# Patient Record
Sex: Female | Born: 1970 | Race: White | Hispanic: No | Marital: Married | Smoking: Never smoker
Health system: Southern US, Community
[De-identification: ages and names within clinical notes are randomized; demographics above are authoritative.]

## PROBLEM LIST (undated history)

## (undated) DIAGNOSIS — E079 Disorder of thyroid, unspecified: Secondary | ICD-10-CM

## (undated) HISTORY — DX: Disorder of thyroid, unspecified: E07.9

---

## 2005-11-19 HISTORY — PX: ENDOMETRIAL ABLATION: SHX621

## 2007-11-20 HISTORY — PX: BUNIONECTOMY: SHX129

## 2007-11-20 HISTORY — PX: LASIK: SHX215

## 2013-06-03 ENCOUNTER — Encounter: Payer: Self-pay | Admitting: Internal Medicine

## 2013-06-03 ENCOUNTER — Ambulatory Visit (INDEPENDENT_AMBULATORY_CARE_PROVIDER_SITE_OTHER): Admitting: Internal Medicine

## 2013-06-03 VITALS — BP 120/70 | HR 76 | Ht 65.0 in | Wt 165.0 lb

## 2013-06-03 DIAGNOSIS — F419 Anxiety disorder, unspecified: Secondary | ICD-10-CM | POA: Insufficient documentation

## 2013-06-03 DIAGNOSIS — F411 Generalized anxiety disorder: Secondary | ICD-10-CM

## 2013-06-03 DIAGNOSIS — R002 Palpitations: Secondary | ICD-10-CM

## 2013-06-03 DIAGNOSIS — E039 Hypothyroidism, unspecified: Secondary | ICD-10-CM

## 2013-06-03 NOTE — Patient Instructions (Signed)
Your physician recommends that you schedule a follow-up appointment in: As needed  

## 2013-06-03 NOTE — Progress Notes (Signed)
OFFICE NOTE  Chief Complaint:  Family history of pulmonic valve disorder, palpitations  Primary Care Physician: Warrick Parisian, MD  HPI:  Dawn Delacruz pleasant 42 year old female with relatively little past medical history. She has a history of hypothyroidism and recently some problems with anxiety/depression. She started on Effexor and he is increasing the dose. Over the past several months she's had palpitations, but she attributes this to stress. She also is a caffeine user drinking black tea least once or twice daily. She was actually referred for evaluation of her pulmonic valve. According to the patient her father had surgery of his pulmonic valve at age 81. He was told by his cardiologist the family should be screened for pulmonary bowel disorders. She did not provide any further information about what the etiology of his heart valve problem was, but possibilities include pulmonic stenosis which is either congenital or perhaps acquired pneumonic insufficiency. I understand after talking to [name], that he had a history of alcohol, tobacco and drug use in the past. Certainly this could put him at increased risk of acquired abnormalities of the pulmonic valve, including possible endocarditis. Overall she is fairly asymptomatic. She is able to exercise without worsening shortness of breath or chest pain. She recently went hiking and had some problems with tightness in her Achilles, but no claudication. She reports some of the palpitations at night, but are generally tolerable. She's never been told she has a heart murmur. Her brother does have a heart murmur, but was faint and not related to the pulmonic valve.  PMHx:  Past Medical History  Diagnosis Date  . Thyroid disorder     Thyroid levels elevated    Past Surgical History  Procedure Laterality Date  . Cesarean section  1999  . Bunionectomy  2009  . Lasik  2009  . Endometrial ablation  2007    w/ Tubal Ligation    FAMHx:   Family History  Problem Relation Age of Onset  . Cancer Mother 68    Lung and Brain cancer  . Cancer Father 98    Bladder cancer  . Stroke Maternal Grandmother 78  . Diabetes Maternal Grandmother 50  . Heart disease Maternal Grandfather 80  . Cancer Maternal Grandfather 96    Prostate cancer  . Cancer Paternal Grandmother 78    Bone cancer  . Heart disease Paternal Grandfather     SOCHx:   reports that she has never smoked. She has never used smokeless tobacco. She reports that  drinks alcohol. She reports that she does not use illicit drugs.  ALLERGIES:  No Known Allergies  ROS: A comprehensive review of systems was negative except for: Cardiovascular: positive for palpitations Behavioral/Psych: positive for anxiety and depression  HOME MEDS: Current Outpatient Prescriptions  Medication Sig Dispense Refill  . Beclomethasone Dipropionate (QNASL) 80 MCG/ACT AERS Place 2 sprays into the nose daily.      Marland Kitchen levothyroxine (SYNTHROID, LEVOTHROID) 50 MCG tablet Take 50 mcg by mouth daily before breakfast.      . venlafaxine (EFFEXOR) 37.5 MG tablet Take 75 mg by mouth daily.       No current facility-administered medications for this visit.    LABS/IMAGING: No results found for this or any previous visit (from the past 48 hour(s)). No results found.  VITALS: BP 120/70  Pulse 76  Ht 5\' 5"  (1.651 m)  Wt 165 lb (74.844 kg)  BMI 27.46 kg/m2  EXAM: General appearance: alert and no distress Neck: no adenopathy, no carotid  bruit, no JVD, supple, symmetrical, trachea midline and thyroid not enlarged, symmetric, no tenderness/mass/nodules Lungs: clear to auscultation bilaterally Heart: regular rate and rhythm, S1, S2 normal, no murmur, click, rub or gallop Abdomen: soft, non-tender; bowel sounds normal; no masses,  no organomegaly Extremities: extremities normal, atraumatic, no cyanosis or edema Pulses: 2+ and symmetric Skin: Skin color, texture, turgor normal. No rashes or  lesions Neurologic: Grossly normal Psych: Mood and affect normal  EKG: Normal sinus rhythm at 76, no signs of right heart strain or enlargement  ASSESSMENT: 1. Palpitations 2. Family history of unknown pulmonic valve disorder  PLAN: 1.   Dawn Delacruz has some palpitations which are likely benign related to stress, anxiety and caffeine use. I counseled her on how to improve some of these. With regards to her father, is difficult to say what the etiology of his pulmonic disorder is without more information. I've asked her to try to gather that for me. I do not appreciate any heart murmurs on exam, and there are no EKG changes suggestive of right heart enlargement. This would commonly be seen with pulmonic stenosis. Oftentimes pulmonic stenosis is seen along with other congenital abnormalities or genetic disorders such as Noonan syndrome. She does not have any of the typical phenotypic characteristics of this disorder. At this time I don't feel that echocardiography is necessary to evaluate her valve unless there was more compelling information of a condition that is necessary to screen for. We can see her back as needed.  Chrystie Nose, MD, [facility] Attending Cardiologist [facility]  Alianny Toelle C 06/03/2013, 10:46 AM

## 2013-06-05 ENCOUNTER — Encounter: Payer: Self-pay | Admitting: Internal Medicine

## 2015-03-18 ENCOUNTER — Other Ambulatory Visit: Payer: Self-pay | Admitting: Physician Assistant

## 2015-09-06 ENCOUNTER — Other Ambulatory Visit: Payer: Self-pay | Admitting: Physician Assistant

## 2017-03-29 ENCOUNTER — Emergency Department (HOSPITAL_COMMUNITY)

## 2017-03-29 ENCOUNTER — Encounter (HOSPITAL_COMMUNITY): Payer: Self-pay | Admitting: *Deleted

## 2017-03-29 ENCOUNTER — Emergency Department (HOSPITAL_COMMUNITY)
Admission: EM | Admit: 2017-03-29 | Discharge: 2017-03-29 | Disposition: A | Discharge status: Home / Self Care | Attending: Emergency Medicine | Admitting: Emergency Medicine

## 2017-03-29 DIAGNOSIS — E039 Hypothyroidism, unspecified: Secondary | ICD-10-CM | POA: Diagnosis not present

## 2017-03-29 DIAGNOSIS — S5002XA Contusion of left elbow, initial encounter: Secondary | ICD-10-CM | POA: Diagnosis not present

## 2017-03-29 DIAGNOSIS — Y999 Unspecified external cause status: Secondary | ICD-10-CM | POA: Diagnosis not present

## 2017-03-29 DIAGNOSIS — S5012XA Contusion of left forearm, initial encounter: Secondary | ICD-10-CM

## 2017-03-29 DIAGNOSIS — Y929 Unspecified place or not applicable: Secondary | ICD-10-CM | POA: Diagnosis not present

## 2017-03-29 DIAGNOSIS — Y939 Activity, unspecified: Secondary | ICD-10-CM | POA: Insufficient documentation

## 2017-03-29 DIAGNOSIS — W2203XA Walked into furniture, initial encounter: Secondary | ICD-10-CM | POA: Insufficient documentation

## 2017-03-29 DIAGNOSIS — S59902A Unspecified injury of left elbow, initial encounter: Secondary | ICD-10-CM | POA: Diagnosis present

## 2017-03-29 NOTE — ED Triage Notes (Signed)
Pt c/o lt forearm pain she struck it on the foot board of her bed  lmp none

## 2017-03-29 NOTE — ED Provider Notes (Signed)
[facility]-EMERGENCY DEPT Provider Note   CSN: [id number] Arrival date & time: 03/29/17  2031  By signing my name below, I, Dawn Delacruz, attest that this documentation has been prepared under the direction and in the presence of Kerrie Buffalo, NP. Electronically Signed: Cynda Delacruz, Scribe. 03/29/17. 9:51 PM.  History   Chief Complaint Chief Complaint  Patient presents with  . Arm Injury   HPI Comments: Dawn Delacruz is a 46 y.o. female with no pertinent past medical history, who presents to the Emergency Department complaining of sudden-onset, constant left arm pain that began earlier today. Patient states she was playing with her son, when she fell off of the bed and hit the head board, injuring her left forearm. Patient reports associated right knee pain. No modifying factors indicated. Patient denies any fever, chills, nausea, vomiting, numbness, or weakness.  She also hit her right knee but not bad.   The history is provided by the patient. No language interpreter was used.    Past Medical History:  Diagnosis Date  . Thyroid disorder    Thyroid levels elevated    Patient Active Problem List   Diagnosis Date Noted  . Hypothyroid 06/03/2013  . Anxiety 06/03/2013  . Palpitations 06/03/2013    Past Surgical History:  Procedure Laterality Date  . BUNIONECTOMY  2009  . CESAREAN SECTION  1999  . ENDOMETRIAL ABLATION  2007   w/ Tubal Ligation  . LASIK  2009    OB History    No data available       Home Medications    Prior to Admission medications   Medication Sig Start Date End Date Taking? Authorizing Provider  Beclomethasone Dipropionate (QNASL) 80 MCG/ACT AERS Place 2 sprays into the nose daily.    [provider]  levothyroxine (SYNTHROID, LEVOTHROID) 50 MCG tablet Take 50 mcg by mouth daily before breakfast.    [provider]  venlafaxine (EFFEXOR) 37.5 MG tablet Take 75 mg by mouth daily.    [provider]    Family  History Family History  Problem Relation Age of Onset  . Cancer Mother 33       Lung and Brain cancer  . Cancer Father 42       Bladder cancer  . Stroke Maternal Grandmother 60  . Diabetes Maternal Grandmother 50  . Heart disease Maternal Grandfather 80  . Cancer Maternal Grandfather 18       Prostate cancer  . Cancer Paternal Grandmother 56       Bone cancer  . Heart disease Paternal Grandfather     Social History Social History  Substance Use Topics  . Smoking status: Never Smoker  . Smokeless tobacco: Never Used  . Alcohol use Yes     Comment: Socailly, 1 drink every 2-3 months     Allergies   Patient has no known allergies.   Review of Systems Review of Systems  Constitutional: Negative for chills and fever.  Gastrointestinal: Negative for nausea and vomiting.  Musculoskeletal: Positive for arthralgias (right knee, elft forearm).  Skin: Negative for wound.  Neurological: Negative for syncope, weakness, numbness and headaches.  Psychiatric/Behavioral: Negative for confusion.     Physical Exam Updated Vital Signs BP 132/69 (BP Location: Right Arm)   Pulse 84   Temp 97.3 F (36.3 C) (Oral)   Resp 18   SpO2 99%   Physical Exam  Constitutional: She is oriented to person, place, and time. She appears well-developed and well-nourished. No distress.  HENT:  Head: Normocephalic.  Eyes: EOM are normal.  Neck: Neck supple.  Cardiovascular: Normal rate.   Pulmonary/Chest: Effort normal.  Abdominal: Soft. There is no tenderness.  Musculoskeletal: Normal range of motion.  2+ radial pulses, adequate circulation. Swelling and pain to the left forearm. Full range of motion of the wrist, elbow, and shoulder.  Right knee with minimal swelling and tenderness, patient is ambulating without difficulty.  Neurological: She is alert and oriented to person, place, and time. No cranial nerve deficit.  Skin: Skin is warm and dry.  Psychiatric: She has a normal mood and  affect.  Nursing note and vitals reviewed.   ED Treatments / Results  DIAGNOSTIC STUDIES: Oxygen Saturation is 99% on RA, normal by my interpretation.    COORDINATION OF CARE: 9:50 PM Discussed treatment plan with pt at bedside and pt agreed to plan.   Labs (all labs ordered are listed, but only abnormal results are displayed) Labs Reviewed - No data to display  Radiology Dg Forearm Left  Result Date: 03/29/2017 CLINICAL DATA:  Fall with forearm pain EXAM: LEFT FOREARM - 2 VIEW COMPARISON:  None. FINDINGS: There is no evidence of fracture or other focal bone lesions. Soft tissues are unremarkable. IMPRESSION: Negative. Electronically Signed   By: Deatra RobinsonKevin  Herman M.D.   On: 03/29/2017 21:07    Procedures Procedures (including critical care time)  Medications Ordered in ED Medications - No data to display   Initial Impression / Assessment and Plan / ED Course  I have reviewed the triage vital signs and the nursing notes.  Pertinent imaging results that were available during my care of the patient were reviewed by me and considered in my medical decision making (see chart for details).    Final Clinical Impressions(s) / ED Diagnoses  46 y.o. female with contusion to the left forearm and right knee stable for d/c without focal neuro deficits and no fracture/dislocation noted on x-ray. Will treat with NSAIDS and she will ice and elevate the area. She will f/u with her PCP or return here as needed. Final diagnoses:  Contusion of elbow and forearm, left, initial encounter    New Prescriptions Discharge Medication List as of 03/29/2017  9:57 PM     I personally performed the services described in this documentation, which was scribed in my presence. The recorded information has been reviewed and is accurate.     Kerrie Buffaloeese, Naelani Lafrance AdairM, [place]NP 03/31/17 1652    Clarene DukeLittle, Ambrose Finlandachel Morgan, MD 04/04/17 [phone]

## 2017-03-29 NOTE — ED Notes (Signed)
Pt states she was playing with her son and fell off the bed. Negative deformity or swelling.

## 2017-10-09 ENCOUNTER — Encounter: Payer: Self-pay | Admitting: Pulmonary Disease

## 2017-10-09 ENCOUNTER — Ambulatory Visit (INDEPENDENT_AMBULATORY_CARE_PROVIDER_SITE_OTHER): Admitting: Pulmonary Disease

## 2017-10-09 DIAGNOSIS — G4710 Hypersomnia, unspecified: Secondary | ICD-10-CM

## 2017-10-09 NOTE — Addendum Note (Signed)
Addended by: Cornell BarmanWHITTAKER, Daquon Greenleaf M on: 10/09/2017 10:40 AM   Modules accepted: Orders

## 2017-10-09 NOTE — Progress Notes (Signed)
Subjective:    Patient ID: Dawn Delacruz, female    DOB: 10/04/1971, 46 y.o.   MRN: [id number]  HPI  Chief Complaint  Patient presents with  . pulm consult    Pt referred by Dr. Charlies SilversJennifer Couillard MD. Pt sleeps about 6-7 hours each night, up at 5-6 times due to husband snoring and not using his cpap machine. Pt has daytime sleepiness really bad, where she arrives at a location early to take a nap prior.    46 year old woman presents for evaluation of excessive daytime somnolence. She reports feeling exhausted all the time and never feels refreshed after sleeping.  She has unexplained weight gain of 20 pounds.  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  She has to take frequent naps and sometimes make excuses such as going to the toilet to take a 10-minute nap she tries to get to replace.  A few minutes before and takes a nap in the car. Her husband has obstructive sleep apnea sleepiness score is 17 and she reports sleepiness in various situations such as sitting inactive, as a passenger in a car, lying down rest in the afternoons, or sitting quietly after lunch.  Bedtime is between 1030 and 11 PM, sleep latency is about an hour, she sleeps on her side with 1-2 pillows, reports 5-6 nocturnal awakenings including one bathroom visit and wonders if this is due to her husband snoring, and is out of bed at 7 AM feeling tired with occasional dryness of mouth and headaches.  On the weekends she will stay in bed about an hour later and takes a 30-minute nap on the weekends.  As a  young adult she would sleep around 2 AM and wake up around noon.  She has used a mouthguard for bruxism about about 12 years.  She drinks hot tea about 3 times daily and has decaffeinated tea with dinner. She reports occasional palpitations once or twice a year. She has been diagnosed as hypothyroid and takes thyroid supplements  There is no history suggestive of cataplexy, sleep paralysis or  parasomnias      Past Medical History:  Diagnosis Date  . Thyroid disorder    Thyroid levels elevated   Past Surgical History:  Procedure Laterality Date  . BUNIONECTOMY  2009  . CESAREAN SECTION  1999  . ENDOMETRIAL ABLATION  2007   w/ Tubal Ligation  . LASIK  2009    No Known Allergies   Social History   Socioeconomic History  . Marital status: Married    Spouse name: Not on file  . Number of children: Not on file  . Years of education: Not on file  . Highest education level: Not on file  Social Needs  . Financial resource strain: Not on file  . Food insecurity - worry: Not on file  . Food insecurity - inability: Not on file  . Transportation needs - medical: Not on file  . Transportation needs - non-medical: Not on file  Occupational History  . Not on file  Tobacco Use  . Smoking status: Never Smoker  . Smokeless tobacco: Never Used  Substance and Sexual Activity  . Alcohol use: Yes    Comment: Socailly, 1 drink every 2-3 months  . Drug use: No  . Sexual activity: Not on file  Other Topics Concern  . Not on file  Social History Narrative  . Not on file     Family History  Problem Relation Age of  Onset  . Cancer Mother [id number]       Lung and Brain cancer  . Cancer Father 2150       Bladder cancer  . Stroke Maternal Grandmother 8659  . Diabetes Maternal Grandmother 50  . Heart disease Maternal Grandfather 80  . Cancer Maternal Grandfather 10370       Prostate cancer  . Cancer Paternal Grandmother 880       Bone cancer  . Heart disease Paternal Grandfather      Review of Systems  Constitutional: Positive for unexpected weight change. Negative for fever.  HENT: Positive for congestion, sinus pressure and sneezing. Negative for dental problem, ear pain, nosebleeds, postnasal drip, rhinorrhea, sore throat and trouble swallowing.   Eyes: Negative for redness and itching.  Respiratory: Positive for chest tightness. Negative for cough, shortness of breath and  wheezing.   Cardiovascular: Positive for palpitations. Negative for leg swelling.  Gastrointestinal: Negative for nausea and vomiting.  Genitourinary: Negative for dysuria.  Musculoskeletal: Negative for joint swelling.  Skin: Negative for rash.  Allergic/Immunologic: Negative.  Negative for environmental allergies, food allergies and immunocompromised state.  Neurological: Positive for headaches.  Hematological: Does not bruise/bleed easily.  Psychiatric/Behavioral: Negative for dysphoric mood. The patient is nervous/anxious.        Objective:   Physical Exam  Gen. Pleasant, well-nourished, in no distress, normal affect ENT - no lesions, no post nasal drip Neck: No JVD, no thyromegaly, no carotid bruits Lungs: no use of accessory muscles, no dullness to percussion, clear without rales or rhonchi  Cardiovascular: Rhythm regular, heart sounds  normal, no murmurs or gallops, no peripheral edema Abdomen: soft and non-tender, no hepatosplenomegaly, BS normal. Musculoskeletal: No deformities, no cyanosis or clubbing Neuro:  alert, non focal       Assessment & Plan:

## 2017-10-09 NOTE — Patient Instructions (Signed)
Home sleep study will be scheduled.  We discussed possibilities of delayed sleep phase syndrome and narcolepsy.   Advised light exposure for 30 minutes in the morning around 9 AM  Keep sleep diary prior to next visit

## 2017-10-09 NOTE — Assessment & Plan Note (Signed)
Given excessive daytime somnolence, narrow pharyngeal exam, witnessed apneas & loud snoring, obstructive sleep apnea is very likely & an overnight polysomnogram will be scheduled as a home study. The pathophysiology of obstructive sleep apnea , it's cardiovascular consequences & modes of treatment including CPAP were discused with the patient in detail & they evidenced understanding.  Home sleep study will be scheduled.  We discussed possibilities of delayed sleep phase syndrome and narcolepsy.   Advised light exposure for 30 minutes in the morning around 9 AM  Keep sleep diary prior to next visit

## 2017-10-31 DIAGNOSIS — G4733 Obstructive sleep apnea (adult) (pediatric): Secondary | ICD-10-CM | POA: Diagnosis not present

## 2017-11-01 DIAGNOSIS — G4733 Obstructive sleep apnea (adult) (pediatric): Secondary | ICD-10-CM | POA: Diagnosis not present

## 2017-11-04 ENCOUNTER — Telehealth: Payer: Self-pay | Admitting: Pulmonary Disease

## 2017-11-04 NOTE — Telephone Encounter (Signed)
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 Does not explain sleepiness. Suggests another SS such as a NPSG and MSLT to rule out narcolepsy.

## 2017-11-06 ENCOUNTER — Other Ambulatory Visit: Payer: Self-pay | Admitting: *Deleted

## 2017-11-06 DIAGNOSIS — G4710 Hypersomnia, unspecified: Secondary | ICD-10-CM

## 2017-11-06 NOTE — Telephone Encounter (Signed)
Spoke with patient. She is aware of results and options. She wants to think about the next series of SS first. She will call the office back once she has made up her mind. Will keep this encounter open until she calls back.

## 2020-01-05 ENCOUNTER — Other Ambulatory Visit: Payer: Self-pay

## 2020-01-05 ENCOUNTER — Emergency Department (HOSPITAL_COMMUNITY)
Admission: EM | Admit: 2020-01-05 | Discharge: 2020-01-05 | Disposition: A | Discharge status: Home / Self Care | Attending: Emergency Medicine | Admitting: Emergency Medicine

## 2020-01-05 ENCOUNTER — Encounter (HOSPITAL_COMMUNITY): Payer: Self-pay | Admitting: Emergency Medicine

## 2020-01-05 ENCOUNTER — Emergency Department (HOSPITAL_COMMUNITY)

## 2020-01-05 DIAGNOSIS — E039 Hypothyroidism, unspecified: Secondary | ICD-10-CM | POA: Diagnosis not present

## 2020-01-05 DIAGNOSIS — Z79899 Other long term (current) drug therapy: Secondary | ICD-10-CM | POA: Diagnosis not present

## 2020-01-05 DIAGNOSIS — Z881 Allergy status to other antibiotic agents status: Secondary | ICD-10-CM | POA: Diagnosis not present

## 2020-01-05 DIAGNOSIS — Z888 Allergy status to other drugs, medicaments and biological substances status: Secondary | ICD-10-CM | POA: Insufficient documentation

## 2020-01-05 DIAGNOSIS — R05 Cough: Secondary | ICD-10-CM | POA: Diagnosis present

## 2020-01-05 DIAGNOSIS — U071 COVID-19: Secondary | ICD-10-CM | POA: Diagnosis not present

## 2020-01-05 LAB — COMPREHENSIVE METABOLIC PANEL
ALT: 31 U/L (ref 0–44)
AST: 20 U/L (ref 15–41)
Albumin: 4.3 g/dL (ref 3.5–5.0)
Alkaline Phosphatase: 88 U/L (ref 38–126)
Anion gap: 9 (ref 5–15)
BUN: 14 mg/dL (ref 6–20)
CO2: 26 mmol/L (ref 22–32)
Calcium: 9.5 mg/dL (ref 8.9–10.3)
Chloride: 104 mmol/L (ref 98–111)
Creatinine, Ser: 0.93 mg/dL (ref 0.44–1.00)
GFR calc Af Amer: >60 mL/min (ref >60)
GFR calc non Af Amer: >60 mL/min (ref >60)
Glucose, Bld: 89 mg/dL (ref 70–99)
Potassium: 3.9 mmol/L (ref 3.5–5.1)
Sodium: 139 mmol/L (ref 135–145)
Total Bilirubin: 0.2 mg/dL — ABNORMAL LOW (ref 0.3–1.2)
Total Protein: 7.4 g/dL (ref 6.5–8.1)

## 2020-01-05 LAB — CBC WITH DIFFERENTIAL/PLATELET
Abs Immature Granulocytes: 0.02 10*3/uL (ref 0.00–0.07)
Basophils Absolute: 0 10*3/uL (ref 0.0–0.1)
Basophils Relative: 0 %
Eosinophils Absolute: 0.1 10*3/uL (ref 0.0–0.5)
Eosinophils Relative: 2 %
HCT: 42.5 % (ref 36.0–46.0)
Hemoglobin: 14.5 g/dL (ref 12.0–15.0)
Immature Granulocytes: 0 %
Lymphocytes Relative: 31 %
Lymphs Abs: 2.5 10*3/uL (ref 0.7–4.0)
MCH: 27.6 pg (ref 26.0–34.0)
MCHC: 34.1 g/dL (ref 30.0–36.0)
MCV: 80.8 fL (ref 80.0–100.0)
Monocytes Absolute: 0.8 10*3/uL (ref 0.1–1.0)
Monocytes Relative: 10 %
Neutro Abs: 4.6 10*3/uL (ref 1.7–7.7)
Neutrophils Relative %: 57 %
Platelets: 261 10*3/uL (ref 150–400)
RBC: 5.26 MIL/uL — ABNORMAL HIGH (ref 3.87–5.11)
RDW: 12.2 % (ref 11.5–15.5)
WBC: 8 10*3/uL (ref 4.0–10.5)
nRBC: 0 % (ref 0.0–0.2)

## 2020-01-05 LAB — TROPONIN I (HIGH SENSITIVITY): Troponin I (High Sensitivity): <2 ng/L (ref <18)

## 2020-01-05 MED ORDER — KETOROLAC TROMETHAMINE 15 MG/ML IJ SOLN
15.0000 mg | Freq: Once | INTRAMUSCULAR | Status: AC
  Administered 2020-01-05: 15 mg via INTRAVENOUS
  Filled 2020-01-05: qty 1

## 2020-01-05 MED ORDER — ALBUTEROL SULFATE HFA 108 (90 BASE) MCG/ACT IN AERS
6.0000 | INHALATION_SPRAY | Freq: Once | RESPIRATORY_TRACT | Status: AC
  Administered 2020-01-05: 6 via RESPIRATORY_TRACT
  Filled 2020-01-05: qty 6.7

## 2020-01-05 MED ORDER — ACETAMINOPHEN 325 MG PO TABS
650.0000 mg | ORAL_TABLET | Freq: Once | ORAL | Status: AC
Start: 2020-01-05 — End: 2020-01-05
  Administered 2020-01-05: 650 mg via ORAL
  Filled 2020-01-05: qty 2

## 2020-01-05 MED ORDER — BENZONATATE 100 MG PO CAPS: 100.0000 mg | ORAL_CAPSULE | Freq: Three times a day (TID) | ORAL | 0 refills | Status: DC

## 2020-01-05 MED ORDER — SODIUM CHLORIDE 0.9 % IV BOLUS
500.0000 mL | Freq: Once | INTRAVENOUS | Status: AC
Start: 2020-01-05 — End: 2020-01-05
  Administered 2020-01-05: 500 mL via INTRAVENOUS

## 2020-01-05 MED ORDER — ONDANSETRON HCL 4 MG/2ML IJ SOLN
4.0000 mg | Freq: Once | INTRAMUSCULAR | Status: AC
  Administered 2020-01-05: 4 mg via INTRAVENOUS
  Filled 2020-01-05: qty 2

## 2020-01-05 NOTE — ED Provider Notes (Signed)
[facility]-EMERGENCY DEPT Provider Note   CSN: [id number] Arrival date & time: 01/05/20  1658     History Chief Complaint  Patient presents with  . Shortness of Breath    Covid on 2/2  . Cough    Dawn Delacruz is a 49 y.o. female presenting for evaluation of shortness of breath and chest pressure.   Patient states she was diagnosed with Covid 2 weeks ago on February 2.  Patient states her symptoms were improving until 5 days ago.  She then developed worsening shortness of breath, chills, and headache.  She saw her PCP yesterday, had an x-ray which showed pneumonia and she was started on antibiotics.  She states today she is having worsening shortness of breath and chest pressure.  She has pain anytime she breathes in.  She reports chills and headache.  She has been taking the Z-Pak as prescribed.  She used the inhaler this morning with mild improvement which was short-lived.  She was prescribed prednisone when she initially tested positive for Covid, but did not take it due to history of adverse side effects.  She has not taken anything for pain/chills including tylenol or ibuprofen.  HPI     Past Medical History:  Diagnosis Date  . Thyroid disorder    Thyroid levels elevated    Patient Active Problem List   Diagnosis Date Noted  . Hypersomnolence 10/09/2017  . Hypothyroid 06/03/2013  . Anxiety 06/03/2013  . Palpitations 06/03/2013    Past Surgical History:  Procedure Laterality Date  . BUNIONECTOMY  2009  . CESAREAN SECTION  1999  . ENDOMETRIAL ABLATION  2007   w/ Tubal Ligation  . LASIK  2009     OB History   No obstetric history on file.     Family History  Problem Relation Age of Onset  . Cancer Mother 59       Lung and Brain cancer  . Cancer Father 26       Bladder cancer  . Stroke Maternal Grandmother 61  . Diabetes Maternal Grandmother 50  . Heart disease Maternal Grandfather 80  . Cancer Maternal Grandfather 23       Prostate  cancer  . Cancer Paternal Grandmother 92       Bone cancer  . Heart disease Paternal Grandfather     Social History   Tobacco Use  . Smoking status: Never Smoker  . Smokeless tobacco: Never Used  Substance Use Topics  . Alcohol use: Yes    Comment: Socailly, 1 drink every 2-3 months  . Drug use: No    Home Medications Prior to Admission medications   Medication Sig Start Date End Date Taking? Authorizing Provider  albuterol (VENTOLIN HFA) 108 (90 Base) MCG/ACT inhaler Inhale 2 puffs into the lungs every 6 (six) hours as needed for wheezing or shortness of breath. 01/04/20  Yes [provider]  aspirin 81 MG chewable tablet Chew 81 mg by mouth daily.   Yes [provider]  azithromycin (ZITHROMAX) 250 MG tablet Take 250-500 mg by mouth as directed. 01/04/20  Yes [provider]  escitalopram (LEXAPRO) 10 MG tablet Take 10 mg by mouth daily. 08/19/19  Yes [provider]  levothyroxine (SYNTHROID) 100 MCG tablet Take 100 mcg by mouth daily. 11/12/19  Yes [provider]  predniSONE (DELTASONE) 10 MG tablet Take 10 mg by mouth 2 (two) times daily. 12/29/19  Yes [provider]  trimethoprim-polymyxin b (POLYTRIM) ophthalmic solution Place 1 drop  into both eyes 6 (six) times daily. 12/29/19  Yes [provider]  vitamin B-12 (CYANOCOBALAMIN) 500 MCG tablet Take 500 mcg by mouth daily.   Yes [provider]  zinc gluconate 50 MG tablet Take 50 mg by mouth daily.   Yes [provider]  benzonatate (TESSALON) 100 MG capsule Take 1 capsule (100 mg total) by mouth every 8 (eight) hours. 01/05/20   Ranika Mcniel, PA-C    Allergies    Doxycycline and Progesterone  Review of Systems   Review of Systems  Constitutional: Positive for chills.  Respiratory: Positive for cough, chest tightness and shortness of breath.   Cardiovascular: Positive for chest pain.  Gastrointestinal: Positive for nausea.  All other  systems reviewed and are negative.   Physical Exam Updated Vital Signs BP 100/64   Pulse 70   Temp 98.4 F (36.9 C)   Resp 19   SpO2 100%   Physical Exam Vitals and nursing note reviewed.  Constitutional:      General: She is not in acute distress.    Appearance: She is well-developed.     Comments: Appears nontoxic  HENT:     Head: Normocephalic and atraumatic.  Eyes:     Extraocular Movements: Extraocular movements intact.     Conjunctiva/sclera: Conjunctivae normal.     Pupils: Pupils are equal, round, and reactive to light.  Cardiovascular:     Rate and Rhythm: Normal rate and regular rhythm.     Pulses: Normal pulses.  Pulmonary:     Effort: Pulmonary effort is normal. No respiratory distress.     Breath sounds: Normal breath sounds. No wheezing.     Comments: Intermittent cough. Speaking in full sentences. Clear lung sounds Abdominal:     General: There is no distension.     Palpations: Abdomen is soft. There is no mass.     Tenderness: There is no abdominal tenderness. There is no guarding or rebound.  Musculoskeletal:        General: Normal range of motion.     Cervical back: Normal range of motion and neck supple.     Right lower leg: No edema.     Left lower leg: No edema.     Comments: No leg pain or swelling. Pedal pulses 2+  Skin:    General: Skin is warm and dry.     Capillary Refill: Capillary refill takes less than 2 seconds.  Neurological:     Mental Status: She is alert and oriented to person, place, and time.     ED Results / Procedures / Treatments   Labs (all labs ordered are listed, but only abnormal results are displayed) Labs Reviewed  CBC WITH DIFFERENTIAL/PLATELET - Abnormal; Notable for the following components:      Result Value   RBC 5.26 (*)    All other components within normal limits  COMPREHENSIVE METABOLIC PANEL - Abnormal; Notable for the following components:   Total Bilirubin 0.2 (*)    All other components within normal  limits  TROPONIN I (HIGH SENSITIVITY)    EKG EKG Interpretation  Date/Time:  Tuesday January 05 2020 17:57:35 EST Ventricular Rate:  66 PR Interval:    QRS Duration: 79 QT Interval:  407 QTC Calculation: 427 R Axis:   90 Text Interpretation: Sinus rhythm Borderline right axis deviation no prior available for comparison Confirmed by Tilden Fossa [phone]) on 01/05/2020 6:03:27 PM   Radiology DG Chest 2 View  Result Date: 01/05/2020 CLINICAL DATA:  Shortness  of breath. COVID positive. EXAM: CHEST - 2 VIEW COMPARISON:  None. FINDINGS: The heart size is normal. There is no pneumothorax or large pleural effusion. No focal infiltrate. There is a rounded density projecting over the left costophrenic angle, only visualized on the frontal view. There is no acute osseous abnormality. IMPRESSION: No acute cardiopulmonary disease. Rounded density projecting over the left costophrenic angle. This is favored to represent a nipple shadow. A follow-up two-view chest x-ray with nipple markers in 4-6 weeks is recommended for further evaluation of this finding. Electronically Signed   By: Constance Holster M.D.   On: 01/05/2020 18:56    Procedures Procedures (including critical care time)  Medications Ordered in ED Medications  ondansetron (ZOFRAN) injection 4 mg (4 mg Intravenous Given 01/05/20 1801)  acetaminophen (TYLENOL) tablet 650 mg (650 mg Oral Given 01/05/20 1801)  sodium chloride 0.9 % bolus 500 mL (0 mLs Intravenous Stopped 01/05/20 1911)  albuterol (VENTOLIN HFA) 108 (90 Base) MCG/ACT inhaler 6 puff (6 puffs Inhalation Given 01/05/20 1802)  ketorolac (TORADOL) 15 MG/ML injection 15 mg (15 mg Intravenous Given 01/05/20 2050)    ED Course  I have reviewed the triage vital signs and the nursing notes.  Pertinent labs & imaging results that were available during my care of the patient were reviewed by me and considered in my medical decision making (see chart for details).    MDM  Rules/Calculators/A&P                      Patient presenting for evaluation of chest heaviness and shortness of breath in the setting of recent Covid infection.  She was evaluated by her PCP yesterday, reviewed visit notes and results.  I cannot see the x-ray myself, report states there is possible some pneumonia.  D-dimer was negative.  As such, doubt PE.  Consider myocarditis in the setting of Covid.  Consider dehydration.  Consider electrolyte abnormality.  Consider worsening of pneumonia.  Will obtain labs, chest x-ray, troponin, EKG.  Labs interpreted by me, overall reassuring.  Troponin negative.  No leukocytosis.  Electrolytes stable.  Creatinine normal.  Chest x-ray viewed interpreted by me, no pneumonia, proximal effusion, cardiomegaly.  EKG without STEMI.  On reassessment, patient reports nausea is improved.  Headache improved.  Cough and shortness of breath improved with albuterol.  Discussed continued symptomatic treatment at home as this is likely prolonged Covid course.  Discussed close monitoring of symptoms, prompt return with worsening.  Discussed follow-up with primary care symptoms not improving.  At this time, patient appears safe for discharge.  Return precautions given.  Patient states she understands and agrees to plan.  Final Clinical Impression(s) / ED Diagnoses Final diagnoses:  [id number]    Rx / DC Orders ED Discharge Orders         Ordered    benzonatate (TESSALON) 100 MG capsule  Every 8 hours     01/05/20 2018           Franchot Heidelberg, PA-C 01/05/20 2242    Varney Biles, MD 01/06/20 1806

## 2020-01-05 NOTE — ED Triage Notes (Signed)
Pt reports was Covid + on 2/2. Reports still having SOB with exertion. Was seen at PCP yesterday and dx with PNA. Reports not feeling any better.

## 2020-01-05 NOTE — Discharge Instructions (Addendum)
Use Tylenol or ibuprofen as needed for fevers, headaches, or body aches. Use tessalon perles for cough. Use the albuterol inhaler every 4 hours for the next 2 days. After this, use as needed for shortness of breath, chest tightness, or coughing fits.  Make sure you stay well-hydrated with water. Wash your hands frequently to prevent spread of infection. Follow-up with your primary care doctor in 1 week if your symptoms are not improving. Return to the emergency room if you develop chest pain, difficulty breathing, or any new or worsening symptoms.

## 2021-09-12 IMAGING — CR DG CHEST 2V
2 series · 2 of 2 positions shown · non-contrast
Comparison: None.

CLINICAL DATA: Shortness of breath. COVID positive.

EXAM:
CHEST - 2 VIEW

[w chest pa]
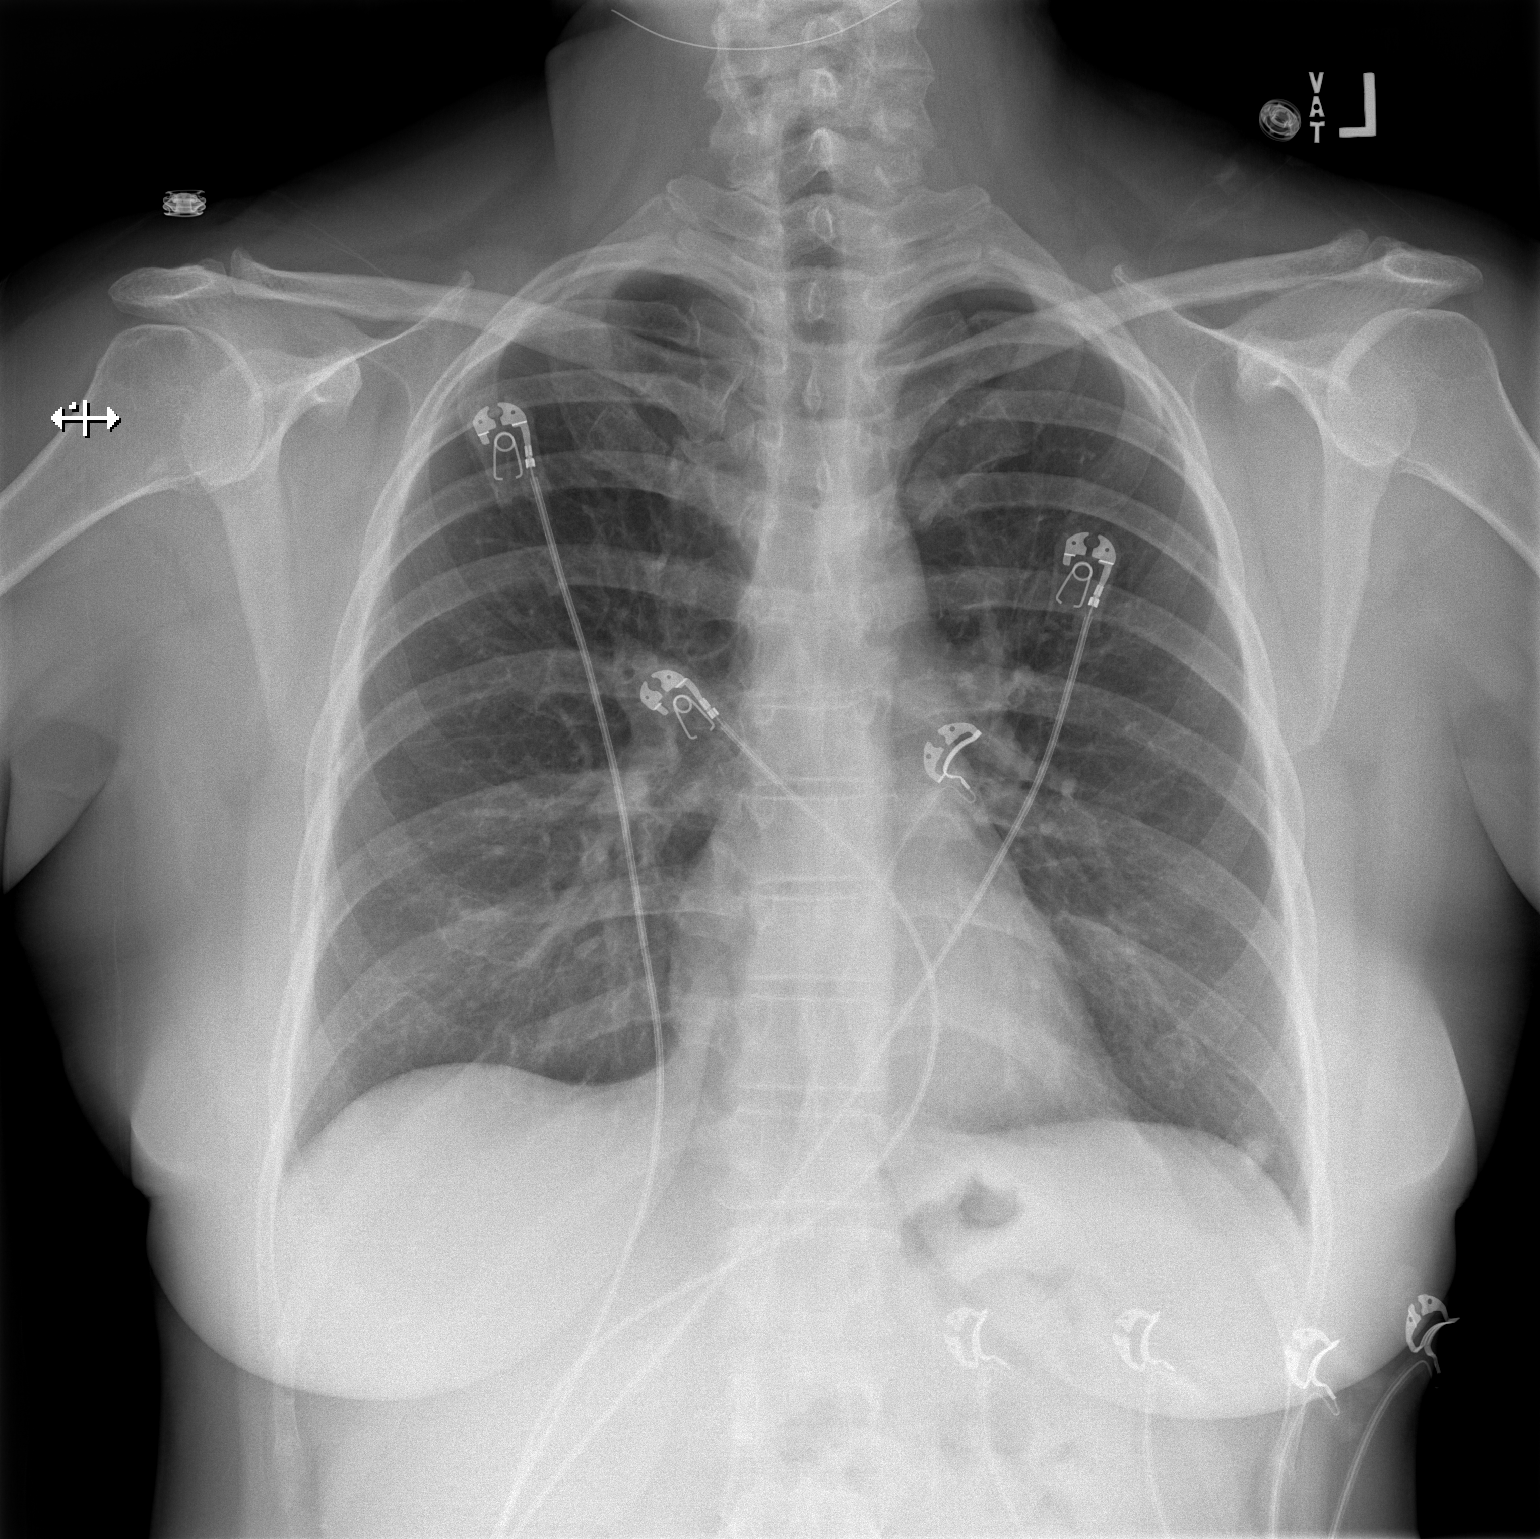

[w chest lat]
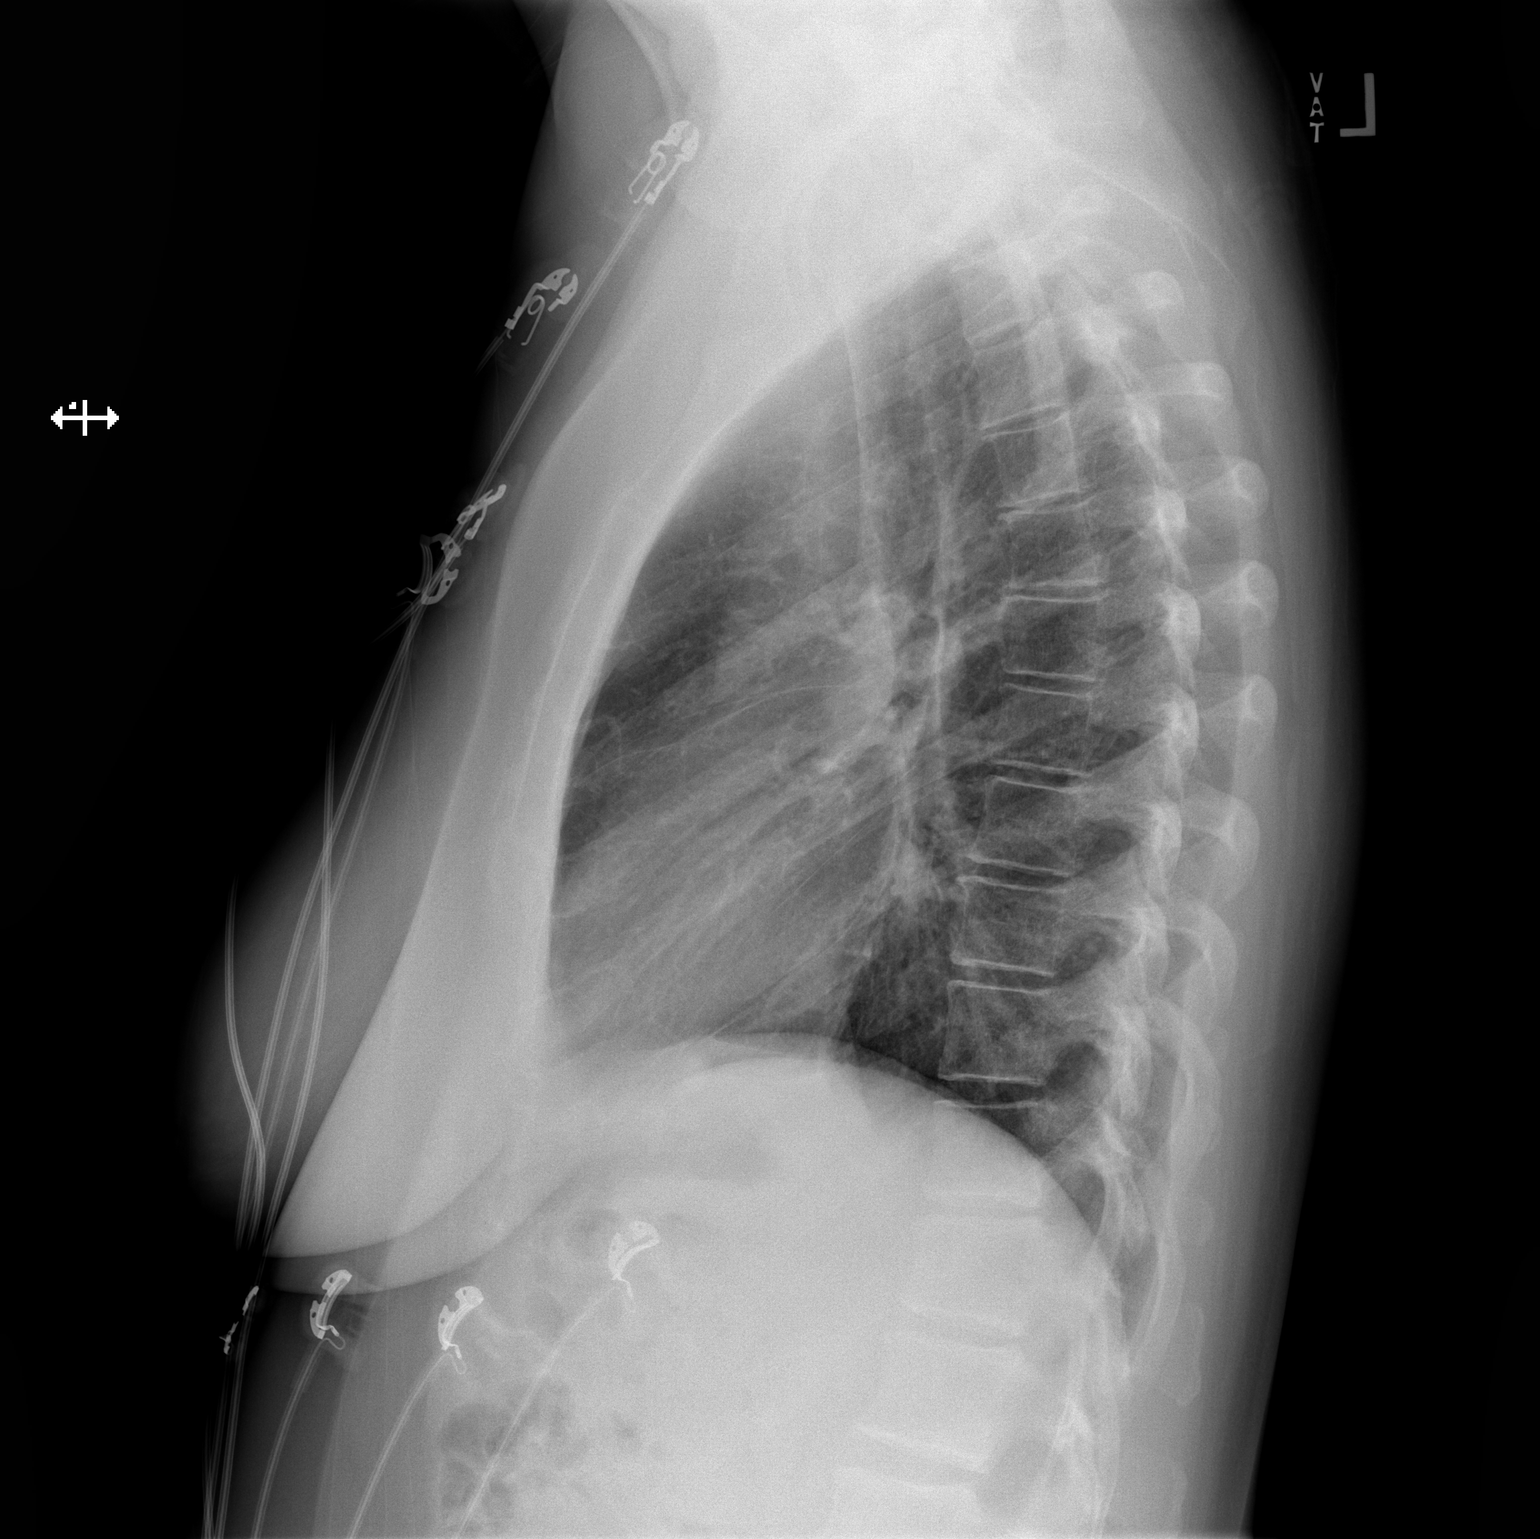

[2 of 2 positions shown; findings below may reference images not displayed]

FINDINGS: The heart size is normal. There is no pneumothorax or large pleural
effusion. No focal infiltrate. There is a rounded density projecting
over the left costophrenic angle, only visualized on the frontal
view. There is no acute osseous abnormality.
IMPRESSION: No acute cardiopulmonary disease.

Rounded density projecting over the left costophrenic angle. This is
favored to represent a nipple shadow. A follow-up two-view chest
x-ray with nipple markers in 4-6 weeks is recommended for further
evaluation of this finding.

## 2022-04-04 ENCOUNTER — Encounter: Payer: Self-pay | Admitting: Primary Care

## 2022-04-04 ENCOUNTER — Ambulatory Visit (INDEPENDENT_AMBULATORY_CARE_PROVIDER_SITE_OTHER): Admitting: Primary Care

## 2022-04-04 VITALS — BP 92/54 | HR 90 | Temp 98.1°F | Ht 65.0 in | Wt 195.8 lb

## 2022-04-04 DIAGNOSIS — G4710 Hypersomnia, unspecified: Secondary | ICD-10-CM

## 2022-04-04 NOTE — Assessment & Plan Note (Signed)
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  At that time she did not wish to pursue additional testing to rule out narcolepsy as cause of hypersomnia.  She continues to have excessive daytime sleepiness.  Her weight is up 25 pounds.  Epworth score 19.  Concern patient could either have obstructive sleep apnea or narcolepsy.  Patient needs in lab sleep study followed by multi sleep latency test.  Reviewed risks of untreated sleep apnea including cardiac arrhythmias, pulmonary hypertension, stroke and diabetes.  We briefly reviewed treatment options including weight loss, side sleeping position, oral appliance, CPAP or referral to ENT for possible surgical options.  Advised patient not to drive experiencing excessive daytime sleepiness or fatigue.  We will discuss insomnia treatment options if needed at follow-up.  Follow-up in 6 weeks virtual visit to review sleep study and treatment options if needed.  She is open to CPAP therapy if needed. ?

## 2022-04-04 NOTE — Progress Notes (Signed)
? ?@Patient  ID: , female    DOB: 25-Jul-1971, 51 y.o.   MRN: [id number] ? ?No chief complaint on file. ? ? ?Referring provider: ?[id number], * ? ?HPI: ?51 year old female, never smoked.  Past medical history significant for hypothyroidism, palpitations, hypersomnolence.  Former patient of Dr. 44, last seen in office in 2018. ? ?04/04/2022 ?Patient presents today for sleep consult.  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  The sleep results did not explain patient's excessive sleepiness, Dr. January 2019 suggested in lab sleep study followed by multi sleep latency test to rule out narcolepsy.  At that time patient did not wish to proceed to additional testing. She has symptoms of excessive daytime sleepiness and insomnia.  She is unsure if she snores.  Her husband snores loudly which does not help her ability to initiate sleep.  She is tried melatonin but this makes her groggy.  Typical bedtime is between 10 and 11 PM.  Typically sleeps on her side.  She wears Invisalign retainers at night.  It can take her several hours to fall asleep.  She wakes up on average once to use the restroom.  She starts her day between 7 - 8 AM.  Daytime sleepiness lasts all day but is worse in the morning.  She had has fallen asleep while using the restroom.  She also has fallen asleep in the middle of conversations.  She has never fallen asleep while driving but has needed to pull over due to fatigue.  She often naps before work in her car in the morning for couple of minutes.  She has 3 children ages 62, 38 and 23.  She works as a 12.  She suspects she is currently going through menopause.  She has positive RA markers, she has been referred to rheumatology.  Her weight is up 25 pounds in the last 2 years.  Denies cataplexy or sleepwalking. ? ?Sleep questionnaire ?Symptoms- excessive daytime sleepiness ?Prior sleep study- 2018, mild OSA in supine position  ?Bedtime- 10-11pm ?Time  to fall asleep- several hours ?Nocturnal awakenings- once  ?Out of bed/start of day- 7-8am ?Weight changes- up 25lbs  ?Do you operate heavy machinery- No  ?Do you currently wear CPAP- No ?Do you current wear oxygen- No ?Epworth- 19 ? ? ?Allergies  ?Allergen Reactions  ? Doxycycline Nausea Only  ? Progesterone Other (See Comments)  ?  Insomnia, diarrhea  ? ? ?Immunization History  ?Administered Date(s) Administered  ? Influenza Nasal 09/04/2011  ? Influenza Split 11/23/2015, 12/24/2016, 09/19/2017  ? Influenza Whole 10/18/2000, 09/16/2001, 10/01/2002  ? Influenza,inj,Quad PF,6+ Mos 09/13/2017, 09/29/2018, 08/17/2019, 08/31/2020, 09/06/2021  ? Influenza-Unspecified 10/10/2004, 08/25/2013, 08/16/2014, 08/15/2015, 11/23/2015, 09/19/2017  ? PFIZER Comirnaty(Gray Top)Covid-19 Tri-Sucrose Vaccine 12/16/2020  ? PFIZER(Purple Top)SARS-COV-2 Vaccination 03/18/2020, 04/11/2020  ? Td 04/22/2002  ? Td,absorbed, Preservative Free, Adult Use, Lf Unspecified 04/22/2002  ? Tdap 12/23/2008, 02/27/2021  ? ? ?Past Medical History:  ?Diagnosis Date  ? Thyroid disorder   ? Thyroid levels elevated  ? ? ?Tobacco History: ?Social History  ? ?Tobacco Use  ?Smoking Status Never  ?Smokeless Tobacco Never  ? ?Counseling given: Not Answered ? ? ?Outpatient Medications Prior to Visit  ?Medication Sig Dispense Refill  ? escitalopram (LEXAPRO) 10 MG tablet Take 10 mg by mouth daily.    ? fluticasone (FLONASE) 50 MCG/ACT nasal spray Place into the nose.    ? levocetirizine (XYZAL) 5 MG tablet     ? levothyroxine (SYNTHROID) 100  MCG tablet Take 100 mcg by mouth daily.    ? montelukast (SINGULAIR) 10 MG tablet Take 10 mg by mouth daily.    ? valACYclovir (VALTREX) 1000 MG tablet     ? vitamin B-12 (CYANOCOBALAMIN) 500 MCG tablet Take 500 mcg by mouth daily.    ? albuterol (VENTOLIN HFA) 108 (90 Base) MCG/ACT inhaler Inhale 2 puffs into the lungs every 6 (six) hours as needed for wheezing or shortness of breath. (Patient not taking: Reported on  04/04/2022)    ? aspirin 81 MG chewable tablet Chew 81 mg by mouth daily. (Patient not taking: Reported on 04/04/2022)    ? azithromycin (ZITHROMAX) 250 MG tablet Take 250-500 mg by mouth as directed. (Patient not taking: Reported on 04/04/2022)    ? benzonatate (TESSALON) 100 MG capsule Take 1 capsule (100 mg total) by mouth every 8 (eight) hours. (Patient not taking: Reported on 04/04/2022) 21 capsule 0  ? predniSONE (DELTASONE) 10 MG tablet Take 10 mg by mouth 2 (two) times daily. (Patient not taking: Reported on 04/04/2022)    ? trimethoprim-polymyxin b (POLYTRIM) ophthalmic solution Place 1 drop into both eyes 6 (six) times daily. (Patient not taking: Reported on 04/04/2022)    ? zinc gluconate 50 MG tablet Take 50 mg by mouth daily. (Patient not taking: Reported on 04/04/2022)    ? ?No facility-administered medications prior to visit.  ? ?Review of Systems ? ?Review of Systems  ?Constitutional:  Positive for fatigue.  ?HENT: Negative.    ?Respiratory: Negative.    ?Psychiatric/Behavioral:  Negative for sleep disturbance.   ? ? ?Physical Exam ? ?BP (!) 92/54 (BP Location: Left Arm, Patient Position: Sitting, Cuff Size: Normal)   Pulse 90   Temp 98.1 ?F (36.7 ?C) (Oral)   Ht 5\' 5"  (1.651 m)   Wt 195 lb 12.8 oz (88.8 kg)   SpO2 96%   BMI 32.58 kg/m?  ?Physical Exam ?Constitutional:   ?   Appearance: Normal appearance.  ?HENT:  ?   Head: Normocephalic and atraumatic.  ?   Mouth/Throat:  ?   Mouth: Mucous membranes are moist.  ?   Pharynx: Oropharynx is clear.  ?   Comments: Mallampati class I ?Cardiovascular:  ?   Rate and Rhythm: Normal rate and regular rhythm.  ?Pulmonary:  ?   Effort: Pulmonary effort is normal.  ?   Breath sounds: Normal breath sounds.  ?Musculoskeletal:     ?   General: Normal range of motion.  ?   Cervical back: Normal range of motion and neck supple.  ?Skin: ?   General: Skin is warm and dry.  ?Neurological:  ?   General: No focal deficit present.  ?   Mental Status: She is alert and  oriented to person, place, and time. Mental status is at baseline.  ?Psychiatric:     ?   Mood and Affect: Mood normal.     ?   Behavior: Behavior normal.     ?   Thought Content: Thought content normal.     ?   Judgment: Judgment normal.  ?  ? ?Lab Results: ? ?CBC ?   ?Component Value Date/Time  ? WBC 8.0 01/05/2020 1759  ? RBC 5.26 (H) 01/05/2020 1759  ? HGB 14.5 01/05/2020 1759  ? HCT 42.5 01/05/2020 1759  ? PLT 261 01/05/2020 1759  ? MCV 80.8 01/05/2020 1759  ? MCH 27.6 01/05/2020 1759  ? MCHC 34.1 01/05/2020 1759  ? RDW 12.2 01/05/2020 1759  ? LYMPHSABS  2.5 01/05/2020 1759  ? MONOABS 0.8 01/05/2020 1759  ? EOSABS 0.1 01/05/2020 1759  ? BASOSABS 0.0 01/05/2020 1759  ? ? ?BMET ?   ?Component Value Date/Time  ? NA 139 01/05/2020 1759  ? K 3.9 01/05/2020 1759  ? CL 104 01/05/2020 1759  ? CO2 26 01/05/2020 1759  ? GLUCOSE 89 01/05/2020 1759  ? BUN 14 01/05/2020 1759  ? CREATININE 0.93 01/05/2020 1759  ? CALCIUM 9.5 01/05/2020 1759  ? GFRNONAA >60 01/05/2020 1759  ? GFRAA >60 01/05/2020 1759  ? ? ?BNP ?No results found for: BNP ? ?ProBNP ?No results found for: PROBNP ? ?Imaging: ?No results found. ? ? ?Assessment & Plan:  ? 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  At that time she did not wish to pursue additional testing to rule out narcolepsy as cause of hypersomnia.  She continues to have excessive daytime sleepiness.  Her weight is up 25 pounds.  Epworth score 19.  Concern patient could either have obstructive sleep apnea or narcolepsy.  Patient needs in lab sleep study followed by multi sleep latency test.  Reviewed risks of untreated sleep apnea including cardiac arrhythmias, pulmonary hypertension, stroke and diabetes.  We briefly reviewed treatment options including weight loss, side sleeping position, oral appliance, CPAP or referral to ENT for possible surgical options.  Advised patient not to drive experiencing excessive daytime sleepiness or fatigue.   We will discuss insomnia treatment options if needed at follow-up.  Follow-up in 6 weeks virtual visit to review sleep study and treatment options if needed.  She is open to CPAP therapy if needed. ? ? ?Antoine PocheElizabeth W Wal

## 2022-04-04 NOTE — Patient Instructions (Addendum)
?Sleep apnea is defined as period of 10 seconds or longer when you stop breathing at night. This can happen multiple times a night. Dx sleep apnea is when this occurs more than 5 times an hour.  ?  ?Mild OSA 5-15 apneic events an hour ?Moderate OSA 15-30 apneic events an hour ?Severe OSA > 30 apneic events an hour ?  ?Untreated sleep apnea puts you at higher risk for cardiac arrhythmias, pulmonary HTN, stroke and diabetes ?  ?Treatment options include weight loss, side sleeping position, oral appliance, CPAP therapy or referral to ENT for possible surgical options  ?  ?Recommendations: ?Focus on side sleeping position ?Work on weight loss efforts  ?Do not drive if experiencing excessive daytime sleepiness of fatigue  ? ?Orders:  ?NPSG and MSLT re: hypersomnia  ?  ?Follow-up: ?6 week virtual follow-up with Beth NP to review results and treatment if needed (if you havent had sleep study before fu visit please reschedule) ? ?Sleep Apnea ?Sleep apnea affects breathing during sleep. It causes breathing to stop for 10 seconds or more, or to become shallow. People with sleep apnea usually snore loudly. ?It can also increase the risk of: ?Heart attack. ?Stroke. ?Being very overweight (obese). ?Diabetes. ?Heart failure. ?Irregular heartbeat. ?High blood pressure. ?The goal of treatment is to help you breathe normally again. ?What are the causes? ? ?The most common cause of this condition is a collapsed or blocked airway. ?There are three kinds of sleep apnea: ?Obstructive sleep apnea. This is caused by a blocked or collapsed airway. ?Central sleep apnea. This happens when the brain does not send the right signals to the muscles that control breathing. ?Mixed sleep apnea. This is a combination of obstructive and central sleep apnea. ?What increases the risk? ?Being overweight. ?Smoking. ?Having a small airway. ?Being older. ?Being female. ?Drinking alcohol. ?Taking medicines to calm yourself (sedatives or  tranquilizers). ?Having family members with the condition. ?Having a tongue or tonsils that are larger than normal. ?What are the signs or symptoms? ?Trouble staying asleep. ?Loud snoring. ?Headaches in the morning. ?Waking up gasping. ?Dry mouth or sore throat in the morning. ?Being sleepy or tired during the day. ?If you are sleepy or tired during the day, you may also: ?Not be able to focus your mind (concentrate). ?Forget things. ?Get angry a lot and have mood swings. ?Feel sad (depressed). ?Have changes in your personality. ?Have less interest in sex, if you are female. ?Be unable to have an erection, if you are female. ?How is this treated? ? ?Sleeping on your side. ?Using a medicine to get rid of mucus in your nose (decongestant). ?Avoiding the use of alcohol, medicines to help you relax, or certain pain medicines (narcotics). ?Losing weight, if needed. ?Changing your diet. ?Quitting smoking. ?Using a machine to open your airway while you sleep, such as: ?An oral appliance. This is a mouthpiece that shifts your lower jaw forward. ?A CPAP device. This device blows air through a mask when you breathe out (exhale). ?An EPAP device. This has valves that you put in each nostril. ?A BIPAP device. This device blows air through a mask when you breathe in (inhale) and breathe out. ?Having surgery if other treatments do not work. ?Follow these instructions at home: ?Lifestyle ?Make changes that your doctor recommends. ?Eat a healthy diet. ?Lose weight if needed. ?Avoid alcohol, medicines to help you relax, and some pain medicines. ?Do not smoke or use any products that contain nicotine or tobacco. If you need help  quitting, ask your doctor. ?General instructions ?Take over-the-counter and prescription medicines only as told by your doctor. ?If you were given a machine to use while you sleep, use it only as told by your doctor. ?If you are having surgery, make sure to tell your doctor you have sleep apnea. You may need to  bring your device with you. ?Keep all follow-up visits. ?Contact a doctor if: ?The machine that you were given to use during sleep bothers you or does not seem to be working. ?You do not get better. ?You get worse. ?Get help right away if: ?Your chest hurts. ?You have trouble breathing in enough air. ?You have an uncomfortable feeling in your back, arms, or stomach. ?You have trouble talking. ?One side of your body feels weak. ?A part of your face is hanging down. ?These symptoms may be an emergency. Get help right away. Call your local emergency services (911 in the [place] ?Do not wait to see if the symptoms will go away. ?Do not drive yourself to the hospital. ?Summary ?This condition affects breathing during sleep. ?The most common cause is a collapsed or blocked airway. ?The goal of treatment is to help you breathe normally while you sleep. ?This information is not intended to replace advice given to you by your health care provider. Make sure you discuss any questions you have with your health care provider. ?Document Revised: 06/14/2021 Document Reviewed: 10/14/2020 ?Elsevier Patient Education ? 2023 Elsevier Inc. ? ?

## 2022-06-05 ENCOUNTER — Ambulatory Visit (HOSPITAL_BASED_OUTPATIENT_CLINIC_OR_DEPARTMENT_OTHER): Attending: Primary Care | Admitting: Pulmonary Disease

## 2022-06-05 DIAGNOSIS — G4733 Obstructive sleep apnea (adult) (pediatric): Secondary | ICD-10-CM | POA: Insufficient documentation

## 2022-06-05 DIAGNOSIS — G4710 Hypersomnia, unspecified: Secondary | ICD-10-CM | POA: Insufficient documentation

## 2022-06-06 ENCOUNTER — Encounter (HOSPITAL_BASED_OUTPATIENT_CLINIC_OR_DEPARTMENT_OTHER): Admitting: Pulmonary Disease

## 2022-06-11 DIAGNOSIS — G4710 Hypersomnia, unspecified: Secondary | ICD-10-CM | POA: Diagnosis not present

## 2022-06-11 NOTE — Procedures (Signed)
Patient Name: Dawn Delacruz, Dawn Delacruz Date: 06/05/2022 Gender: Female D.O.B: 06/29/1971 Age (years): 55 Referring Provider: Ames Dura NP Height (inches): 65 Interpreting Physician: Cyril Mourning MD, ABSM Weight (lbs): 190 RPSGT: Ulyess Mort BMI: 32 MRN: [id number] Neck Size: 14.50 <br> <br> CLINICAL INFORMATION Sleep Study Type: NPSG    Indication for sleep study: Daytime Fatigue, Depression, Fatigue, Obesity Previous home sleep test had shown mild OSA mainly in the supine position    Epworth Sleepiness Score: 21    SLEEP STUDY TECHNIQUE As per the AASM Manual for the Scoring of Sleep and Associated Events v2.3 (April 2016) with a hypopnea requiring 4% desaturations.  The channels recorded and monitored were frontal, central and occipital EEG, electrooculogram (EOG), submentalis EMG (chin), nasal and oral airflow, thoracic and abdominal wall motion, anterior tibialis EMG, snore microphone, electrocardiogram, and pulse oximetry.  MEDICATIONS Medications self-administered by patient taken the night of the study : XYZAL, SINGULAIR, VITAMIN B COMPLEX W/B-12, VITAMIN D, LEXAPRO  SLEEP ARCHITECTURE The study was initiated at 10:44:00 PM and ended at 5:47:20 AM.  Sleep onset time was 64.1 minutes and the sleep efficiency was 74.7%%. The total sleep time was 316.2 minutes.  Stage REM latency was 320.5 minutes.  The patient spent 6.8%% of the night in stage N1 sleep, 65.3%% in stage N2 sleep, 16.9%% in stage N3 and 11% in REM.  Alpha intrusion was absent.  Supine sleep was 23.90%.  RESPIRATORY PARAMETERS The overall apnea/hypopnea index (AHI) was 25.8 per hour. There were 4 total apneas, including 1 obstructive, 2 central and 1 mixed apneas. There were 132 hypopneas and 147 RERAs with RDI 51/h  The AHI during Stage REM sleep was 20.7 per hour.  AHI while supine was 28.6 per hour.  The mean oxygen saturation was 91.6%. The minimum SpO2 during sleep was 80.0%.  soft  snoring was noted during this study.  CARDIAC DATA The 2 lead EKG demonstrated sinus rhythm. The mean heart rate was 72.8 beats per minute. Other EKG findings include: None.   LEG MOVEMENT DATA The total PLMS were 0 with a resulting PLMS index of 0.0. Associated arousal with leg movement index was 0.9 .  IMPRESSIONS - Moderate obstructive sleep apnea occurred during this study (AHI = 25.8/h). Events were predominantly hypopeas & RERAs - No significant central sleep apnea occurred during this study (CAI = 0.4/h). - Moderate oxygen desaturation was noted during this study (Min O2 = 80.0%). - The patient snored with soft snoring volume. - No cardiac abnormalities were noted during this study. - Clinically significant periodic limb movements did not occur during sleep. No significant associated arousals.   DIAGNOSIS - Obstructive Sleep Apnea (G47.33)   RECOMMENDATIONS - Therapeutic CPAP titration to determine optimal pressure required to alleviate sleep disordered breathing. Alternatively, autoCPAP 5-15 cm can be tried with mask of choice - Avoid alcohol, sedatives and other CNS depressants that may worsen sleep apnea and disrupt normal sleep architecture. - Sleep hygiene should be reviewed to assess factors that may improve sleep quality. - Weight management and regular exercise should be initiated or continued if appropriate.   Cyril Mourning MD Board Certified in Sleep medicine

## 2022-06-20 ENCOUNTER — Encounter: Payer: Self-pay | Admitting: Primary Care

## 2022-06-20 ENCOUNTER — Telehealth (INDEPENDENT_AMBULATORY_CARE_PROVIDER_SITE_OTHER): Admitting: Primary Care

## 2022-06-20 DIAGNOSIS — G4733 Obstructive sleep apnea (adult) (pediatric): Secondary | ICD-10-CM | POA: Diagnosis not present

## 2022-06-20 DIAGNOSIS — G4710 Hypersomnia, unspecified: Secondary | ICD-10-CM | POA: Diagnosis not present

## 2022-06-20 NOTE — Patient Instructions (Addendum)
Sleep study showed evidence of moderate obstructive sleep apnea, on average you had 25 apneic/hypopneic events an hour oxygen desaturations  Treatment options include weight loss, oral appliance, CPAP therapy or referral to ENT for possible surgical options  Recommend to be started on CPAP due to severity of OSA and significant excessive daytime sleepiness  Aim to wear CPAP every night min 4-6 hours or longer  Orders New CPAP 5 to 15 cm H2O with mask of choice (ordered)  Follow-up 31 to 90 days after starting CPAP for compliance check (recall placed for 3 months)   CPAP and BIPAP Information CPAP and BIPAP are methods that use air pressure to keep your airways open and to help you breathe well. CPAP and BIPAP use different amounts of pressure. Your health care provider will tell you whether CPAP or BIPAP would be more helpful for you. CPAP stands for "continuous positive airway pressure." With CPAP, the amount of pressure stays the same while you breathe in (inhale) and out (exhale). BIPAP stands for "bi-level positive airway pressure." With BIPAP, the amount of pressure will be higher when you inhale and lower when you exhale. This allows you to take larger breaths. CPAP or BIPAP may be used in the hospital, or your health care provider may want you to use it at home. You may need to have a sleep study before your health care provider can order a machine for you to use at home. What are the advantages? CPAP or BIPAP can be helpful if you have: Sleep apnea. Chronic obstructive pulmonary disease (COPD). Heart failure. Medical conditions that cause muscle weakness, including muscular dystrophy or amyotrophic lateral sclerosis (ALS). Other problems that cause breathing to be shallow, weak, abnormal, or difficult. CPAP and BIPAP are most commonly used for obstructive sleep apnea (OSA) to keep the airways from collapsing when the muscles relax during sleep. What are the risks? Generally, this  is a safe treatment. However, problems may occur, including: Irritated skin or skin sores if the mask does not fit properly. Dry or stuffy nose or nosebleeds. Dry mouth. Feeling gassy or bloated. Sinus or lung infection if the equipment is not cleaned properly. When should CPAP or BIPAP be used? In most cases, the mask only needs to be worn during sleep. Generally, the mask needs to be worn throughout the night and during any daytime naps. People with certain medical conditions may also need to wear the mask at other times, such as when they are awake. Follow instructions from your health care provider about when to use the machine. What happens during CPAP or BIPAP?  Both CPAP and BIPAP are provided by a small machine with a flexible plastic tube that attaches to a plastic mask that you wear. Air is blown through the mask into your nose or mouth. The amount of pressure that is used to blow the air can be adjusted on the machine. Your health care provider will set the pressure setting and help you find the best mask for you. Tips for using the mask Because the mask needs to be snug, some people feel trapped or closed-in (claustrophobic) when first using the mask. If you feel this way, you may need to get used to the mask. One way to do this is to hold the mask loosely over your nose or mouth and then gradually apply the mask more snugly. You can also gradually increase the amount of time that you use the mask. Masks are available in various types and  sizes. If your mask does not fit well, talk with your health care provider about getting a different one. Some common types of masks include: Full face masks, which fit over the mouth and nose. Nasal masks, which fit over the nose. Nasal pillow or prong masks, which fit into the nostrils. If you are using a mask that fits over your nose and you tend to breathe through your mouth, a chin strap may be applied to help keep your mouth closed. Use a skin  barrier to protect your skin as told by your health care provider. Some CPAP and BIPAP machines have alarms that may sound if the mask comes off or develops a leak. If you have trouble with the mask, it is very important that you talk with your health care provider about finding a way to make the mask easier to tolerate. Do not stop using the mask. There could be a negative impact on your health if you stop using the mask. Tips for using the machine Place your CPAP or BIPAP machine on a secure table or stand near an electrical outlet. Know where the on/off switch is on the machine. Follow instructions from your health care provider about how to set the pressure on your machine and when you should use it. Do not eat or drink while the CPAP or BIPAP machine is on. Food or fluids could get pushed into your lungs by the pressure of the CPAP or BIPAP. For home use, CPAP and BIPAP machines can be rented or purchased through home health care companies. Many different brands of machines are available. Renting a machine before purchasing may help you find out which particular machine works well for you. Your health insurance company may also decide which machine you may get. Keep the CPAP or BIPAP machine and attachments clean. Ask your health care provider for specific instructions. Check the humidifier if you have a dry stuffy nose or nosebleeds. Make sure it is working correctly. Follow these instructions at home: Take over-the-counter and prescription medicines only as told by your health care provider. Ask if you can take sinus medicine if your sinuses are blocked. Do not use any products that contain nicotine or tobacco. These products include cigarettes, chewing tobacco, and vaping devices, such as e-cigarettes. If you need help quitting, ask your health care provider. Keep all follow-up visits. This is important. Contact a health care provider if: You have redness or pressure sores on your head, face,  mouth, or nose from the mask or head gear. You have trouble using the CPAP or BIPAP machine. You cannot tolerate wearing the CPAP or BIPAP mask. Someone tells you that you snore even when wearing your CPAP or BIPAP. Get help right away if: You have trouble breathing. You feel confused. Summary CPAP and BIPAP are methods that use air pressure to keep your airways open and to help you breathe well. If you have trouble with the mask, it is very important that you talk with your health care provider about finding a way to make the mask easier to tolerate. Do not stop using the mask. There could be a negative impact to your health if you stop using the mask. Follow instructions from your health care provider about when to use the machine. This information is not intended to replace advice given to you by your health care provider. Make sure you discuss any questions you have with your health care provider. Document Revised: 06/14/2021 Document Reviewed: 10/14/2020 Elsevier Patient Education  2023 Elsevier Inc.  

## 2022-06-20 NOTE — Progress Notes (Signed)
Virtual Visit via Video Note  I connected with Dawn Delacruz on 06/20/22 at 10:30 AM EDT by a video enabled telemedicine application and verified that I am speaking with the correct person using two identifiers.  Location: Patient: Home Provider: Office    I discussed the limitations of evaluation and management by telemedicine and the availability of in person appointments. The patient expressed understanding and agreed to proceed.  History of Present Illness: 51 year old female, never smoked.  Past medical history significant for hypothyroidism, palpitations, hypersomnolence.  Former patient of Dr. Vassie Loll, last seen in office in 2018.  Previous [facility] pulmonary encounter: 04/04/2022 Patient presents today for sleep consult.  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  The sleep results did not explain patient's excessive sleepiness, Dr. Vassie Loll suggested in lab sleep study followed by multi sleep latency test to rule out narcolepsy.  At that time patient did not wish to proceed to additional testing. She has symptoms of excessive daytime sleepiness and insomnia.  She is unsure if she snores.  Her husband snores loudly which does not help her ability to initiate sleep.  She is tried melatonin but this makes her groggy.  Typical bedtime is between 10 and 11 PM.  Typically sleeps on her side.  She wears Invisalign retainers at night.  It can take her several hours to fall asleep.  She wakes up on average once to use the restroom.  She starts her day between 7 - 8 AM.  Daytime sleepiness lasts all day but is worse in the morning.  She had has fallen asleep while using the restroom.  She also has fallen asleep in the middle of conversations.  She has never fallen asleep while driving but has needed to pull over due to fatigue.  She often naps before work in her car in the morning for couple of minutes.  She has 3 children ages 67, 22 and 38.  She works as a Social worker.  She  suspects she is currently going through menopause.  She has positive RA markers, she has been referred to rheumatology.  Her weight is up 25 pounds in the last 2 years.  Denies cataplexy or sleepwalking.  Sleep questionnaire Symptoms- excessive daytime sleepiness Prior sleep study- 2018, mild OSA in supine position  Bedtime- 10-11pm Time to fall asleep- several hours Nocturnal awakenings- once  Out of bed/start of day- 7-8am Weight changes- up 25lbs  Do you operate heavy machinery- No  Do you currently wear CPAP- No Do you current wear oxygen- No Epworth- 19  06/20/2022- Interim hx Patient contacted today to review sleep study results.  She had NPSG on 06/05/2022 that showed evidence of moderate obstructive sleep apnea, AHI 25.8/hour with SPO2 low 80%. No central sleep apneas, cardiac abnormalities or periodic limb movements. Reviewed treatment options including weight loss, oral appliance, CPAP therapy or referral to ENT. Recommending patient be started on CPAP due to severity of OSA and daytime sleepiness. She is open to starting CPAP for management of OSA.   Observations/Objective:  - Appears well; No overt respiratory symptoms   Assessment and Plan:  Moderate OSA; - NPSG 06/05/22>> AHI 25.8/hr  - Recommending patient be started on CPAP due to severity of OSA excessive daytime sleepiness - Place an order for patient to be provided with CPAP machine auto titrate 5 to 15 cm H2O with mask of choice. Advised she aim to wear minimum 4-6 hours a night. Encourage weight loss efforts  and avoid driving if experiencing excessive daytime sleepiness.   Follow Up Instructions:   FU 31-90 days after starting CPAP  I discussed the assessment and treatment plan with the patient. The patient was provided an opportunity to ask questions and all were answered. The patient agreed with the plan and demonstrated an understanding of the instructions.   The patient was advised to call back or seek an  in-person evaluation if the symptoms worsen or if the condition fails to improve as anticipated.  I provided 30 minutes of non-face-to-face time during this encounter.   Glenford Bayley, NP

## 2022-08-08 ENCOUNTER — Ambulatory Visit: Admitting: Primary Care

## 2022-08-14 NOTE — Progress Notes (Unsigned)
@Patient  ID: , female    DOB: 08-03-1971, 51 y.o.   MRN: [id number]  No chief complaint on file.   Referring provider: [id number], *  HPI: 51 year old female, never smoked.  Past medical history significant for hypothyroidism, palpitations, hypersomnolence.  Former patient of Dr. 44, last seen in office in 2018.  Previous [facility] pulmonary encounter: 04/04/2022 Patient presents today for sleep consult.  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  The sleep results did not explain patient's excessive sleepiness, Dr. January 2019 suggested in lab sleep study followed by multi sleep latency test to rule out narcolepsy.  At that time patient did not wish to proceed to additional testing. She has symptoms of excessive daytime sleepiness and insomnia.  She is unsure if she snores.  Her husband snores loudly which does not help her ability to initiate sleep.  She is tried melatonin but this makes her groggy.  Typical bedtime is between 10 and 11 PM.  Typically sleeps on her side.  She wears Invisalign retainers at night.  It can take her several hours to fall asleep.  She wakes up on average once to use the restroom.  She starts her day between 7 - 8 AM.  Daytime sleepiness lasts all day but is worse in the morning.  She had has fallen asleep while using the restroom.  She also has fallen asleep in the middle of conversations.  She has never fallen asleep while driving but has needed to pull over due to fatigue.  She often naps before work in her car in the morning for couple of minutes.  She has 3 children ages 24, 61 and 66.  She works as a 12.  She suspects she is currently going through menopause.  She has positive RA markers, she has been referred to rheumatology.  Her weight is up 25 pounds in the last 2 years.  Denies cataplexy or sleepwalking.  Sleep questionnaire Symptoms- excessive daytime sleepiness Prior sleep study- 2018, mild OSA in supine  position  Bedtime- 10-11pm Time to fall asleep- several hours Nocturnal awakenings- once  Out of bed/start of day- 7-8am Weight changes- up 25lbs  Do you operate heavy machinery- No  Do you currently wear CPAP- No Do you current wear oxygen- No Epworth- 19  06/20/2022- Interim hx Patient contacted today to review sleep study results.  She had NPSG on 06/05/2022 that showed evidence of moderate obstructive sleep apnea, AHI 25.8/hour with SPO2 low 80%. No central sleep apneas, cardiac abnormalities or periodic limb movements. Reviewed treatment options including weight loss, oral appliance, CPAP therapy or referral to ENT. Recommending patient be started on CPAP due to severity of OSA and daytime sleepiness. She is open to starting CPAP for management of OSA.   Moderate OSA; - NPSG 06/05/22>> AHI 25.8/hr  - Recommending patient be started on CPAP due to severity of OSA excessive daytime sleepiness - Place an order for patient to be provided with CPAP machine auto titrate 5 to 15 cm H2O with mask of choice. Advised she aim to wear minimum 4-6 hours a night. Encourage weight loss efforts and avoid driving if experiencing excessive daytime sleepiness.    08/15/2022- Interim hx  Patient presents today for 1 to 38-month follow-up.  NPSG on 06/05/2022 showed moderate obstructive sleep apnea, AHI 25.8 an hour.  Patient was started on auto CPAP 5-15cm h20 due to excessive daytime sleepiness.  Allergies  Allergen Reactions   Doxycycline Nausea Only   Progesterone Other (See Comments)    Insomnia, diarrhea    Immunization History  Administered Date(s) Administered   Influenza Nasal 09/04/2011   Influenza Split 11/23/2015, 12/24/2016, 09/19/2017   Influenza Whole 10/18/2000, 09/16/2001, 10/01/2002   Influenza,inj,Quad PF,6+ Mos 09/13/2017, 09/29/2018, 08/17/2019, 08/31/2020, 09/06/2021   Influenza-Unspecified 10/10/2004, 08/25/2013, 08/16/2014, 08/15/2015, 11/23/2015, 09/19/2017   PFIZER  Comirnaty(Gray Top)Covid-19 Tri-Sucrose Vaccine 12/16/2020   PFIZER(Purple Top)SARS-COV-2 Vaccination 03/18/2020, 04/11/2020   Td 04/22/2002   Td,absorbed, Preservative Free, Adult Use, Lf Unspecified 04/22/2002   Tdap 12/23/2008, 02/27/2021    Past Medical History:  Diagnosis Date   Thyroid disorder    Thyroid levels elevated    Tobacco History: Social History   Tobacco Use  Smoking Status Never  Smokeless Tobacco Never   Counseling given: Not Answered   Outpatient Medications Prior to Visit  Medication Sig Dispense Refill   benzonatate (TESSALON) 100 MG capsule Take 1 capsule (100 mg total) by mouth every 8 (eight) hours. (Patient not taking: Reported on 04/04/2022) 21 capsule 0   escitalopram (LEXAPRO) 10 MG tablet Take 10 mg by mouth daily.     fluticasone (FLONASE) 50 MCG/ACT nasal spray Place into the nose.     levocetirizine (XYZAL) 5 MG tablet      levothyroxine (SYNTHROID) 100 MCG tablet Take 100 mcg by mouth daily.     montelukast (SINGULAIR) 10 MG tablet Take 10 mg by mouth daily.     valACYclovir (VALTREX) 1000 MG tablet      vitamin B-12 (CYANOCOBALAMIN) 500 MCG tablet Take 500 mcg by mouth daily.     No facility-administered medications prior to visit.      Review of Systems  Review of Systems   Physical Exam  There were no vitals taken for this visit. Physical Exam   Lab Results:  CBC    Component Value Date/Time   WBC 8.0 01/05/2020 1759   RBC 5.26 (H) 01/05/2020 1759   HGB 14.5 01/05/2020 1759   HCT 42.5 01/05/2020 1759   PLT 261 01/05/2020 1759   MCV 80.8 01/05/2020 1759   MCH 27.6 01/05/2020 1759   MCHC 34.1 01/05/2020 1759   RDW 12.2 01/05/2020 1759   LYMPHSABS 2.5 01/05/2020 1759   MONOABS 0.8 01/05/2020 1759   EOSABS 0.1 01/05/2020 1759   BASOSABS 0.0 01/05/2020 1759    BMET    Component Value Date/Time   NA 139 01/05/2020 1759   K 3.9 01/05/2020 1759   CL 104 01/05/2020 1759   CO2 26 01/05/2020 1759   GLUCOSE 89  01/05/2020 1759   BUN 14 01/05/2020 1759   CREATININE 0.93 01/05/2020 1759   CALCIUM 9.5 01/05/2020 1759   GFRNONAA >60 01/05/2020 1759   GFRAA >60 01/05/2020 1759    BNP No results found for: "BNP"  ProBNP No results found for: "PROBNP"  Imaging: No results found.   Assessment & Plan:   No problem-specific Assessment & Plan notes found for this encounter.     Martyn Ehrich, NP 08/14/2022

## 2022-08-15 ENCOUNTER — Ambulatory Visit (INDEPENDENT_AMBULATORY_CARE_PROVIDER_SITE_OTHER): Admitting: Primary Care

## 2022-08-15 ENCOUNTER — Encounter: Payer: Self-pay | Admitting: Primary Care

## 2022-08-15 DIAGNOSIS — J209 Acute bronchitis, unspecified: Secondary | ICD-10-CM

## 2022-08-15 DIAGNOSIS — J302 Other seasonal allergic rhinitis: Secondary | ICD-10-CM | POA: Diagnosis not present

## 2022-08-15 DIAGNOSIS — G4730 Sleep apnea, unspecified: Secondary | ICD-10-CM | POA: Insufficient documentation

## 2022-08-15 DIAGNOSIS — G4733 Obstructive sleep apnea (adult) (pediatric): Secondary | ICD-10-CM

## 2022-08-15 MED ORDER — AMOXICILLIN-POT CLAVULANATE 875-125 MG PO TABS: 1.0000 | ORAL_TABLET | Freq: Two times a day (BID) | ORAL | 0 refills | Status: DC

## 2022-08-15 NOTE — Assessment & Plan Note (Addendum)
-   NPSG 06/05/22 showed moderate OSA, AHI 25.8. Started on auto CPAP in August 2023. She is 80% compliant with use > 4 hours last 30 days. Average usage 5 hours 36 mins. Pressure 5-15cm h20; AHI 2.4/hour. No pressure changes needed. Recommend she try either nasal mask or nasal cradle mask d/t skin irritation for nasal pillows. FU 6 months or sooner if needed.

## 2022-08-15 NOTE — Assessment & Plan Note (Signed)
-   Continue Singulair, Xyzal and Flonase as prescribed

## 2022-08-15 NOTE — Assessment & Plan Note (Addendum)
-   Patient has had a productive cough for 3 weeks with purulent mucus. Cough is making it difficulty for her to wear CPAP at night. She has no associated sob or wheezing. Sending in Augmentin 1 tab twice daily x 5 days. Advised she take mucinex-dm twice daily.

## 2022-08-15 NOTE — Patient Instructions (Addendum)
Recommendations: Continue to wear CPAP every night for 4-6 hours or longer Try either bacitracin or Vaseline to nasal passages You can also look at changing to regular nasal mask or nasal cradle mask Take Mucinex-dm 7-10 days Start abx for bronchitis  No flu shot today, once better  Rx: Augmentin 1 tab twice daily x 5 days   Follow-up: 6 months with Eustaquio Maize NP

## 2022-08-20 ENCOUNTER — Other Ambulatory Visit: Payer: Self-pay | Admitting: Primary Care

## 2023-02-13 ENCOUNTER — Encounter: Payer: Self-pay | Admitting: Primary Care

## 2023-02-13 ENCOUNTER — Ambulatory Visit (INDEPENDENT_AMBULATORY_CARE_PROVIDER_SITE_OTHER): Admitting: Primary Care

## 2023-02-13 VITALS — BP 98/62 | HR 83 | Ht 65.0 in | Wt 193.2 lb

## 2023-02-13 DIAGNOSIS — J302 Other seasonal allergic rhinitis: Secondary | ICD-10-CM | POA: Diagnosis not present

## 2023-02-13 DIAGNOSIS — E039 Hypothyroidism, unspecified: Secondary | ICD-10-CM

## 2023-02-13 DIAGNOSIS — G4733 Obstructive sleep apnea (adult) (pediatric): Secondary | ICD-10-CM | POA: Diagnosis not present

## 2023-02-13 MED ORDER — LORATADINE 10 MG PO TABS: 10.0000 mg | ORAL_TABLET | Freq: Every day | ORAL | 3 refills | Status: AC

## 2023-02-13 NOTE — Assessment & Plan Note (Addendum)
-   NPSG 06/05/2022 showed moderate OSA, AHI 25.8 an hour.  Patient was started on CPAP in August 2023 due to excessive daytime sleepiness.  She has not noticed much clinical benefit since starting CPAP.  She continues to have morning headaches and fatigue symptoms. She recently has been struggling with compliance due to life circumstances.  Current pressure 5 to 15 cm H2O; residual AHI 2.9 an hour.  Advised patient be more consistent with CPAP use, encouraged she wear nightly for 6 hours or longer.  Hypothyroidism and chronic sinusitis likely tripping factors to headache and fatigue symptoms. Encourage daily naps with CPAP. We could consider adding medication for hypersomnia such as Provigil at follow-up if symptoms persist.

## 2023-02-13 NOTE — Assessment & Plan Note (Addendum)
-   TSH in October 2023 was 1.36 - Continue Synthroid 180mcgf daily - She is potentially interested in [facility], advised she speak with PCP for referral

## 2023-02-13 NOTE — Assessment & Plan Note (Addendum)
-   Poorly controlled; She is experiencing PND symptoms, morning headache along with fatigue symptoms. Advised she start back fluticasone nasal spray.  Recommend changing from Levocetirizine to Loratadine 10mg  daily. Continue Singulair 10mg  at bedtime. If not better would return to see allergist.

## 2023-02-13 NOTE — Patient Instructions (Addendum)
Try to be more consistent about wearing your CPAP, aim to get 6-8 hours of use a night. Ok to take 3min nap during the day, wear CPAP if napping  [employer] dream wear hybrid full face mask   Change Xyzal to Claritin 10mg  daily (may make you less drowsy)  Continue Singulair 10mg  at bedtime  Start Flonase nasal spray If allergy symptoms persistent recommend seeing allergist to discuss allergy shots/biologics   Follow-up 3 months with Beth NP (CPAP compliant check- can be virtual)

## 2023-02-13 NOTE — Progress Notes (Signed)
@Patient  ID: Dawn Delacruz, female    DOB: 09-17-1971, 52 y.o.   MRN: [id number]  Chief Complaint  Patient presents with   Follow-up    OSA     Referring provider: Heywood Bene, *  HPI: 52 year old female, never smoked.  Past medical history significant for hypothyroidism, palpitations, hypersomnolence.  Former patient of Dr. Elsworth Soho.  Previous [facility] pulmonary encounter: 04/04/2022 Patient presents today for sleep consult.  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  The sleep results did not explain patient's excessive sleepiness, Dr. Elsworth Soho suggested in lab sleep study followed by multi sleep latency test to rule out narcolepsy.  At that time patient did not wish to proceed to additional testing. She has symptoms of excessive daytime sleepiness and insomnia.  She is unsure if she snores.  Her husband snores loudly which does not help her ability to initiate sleep.  She is tried melatonin but this makes her groggy.  Typical bedtime is between 10 and 11 PM.  Typically sleeps on her side.  She wears Invisalign retainers at night.  It can take her several hours to fall asleep.  She wakes up on average once to use the restroom.  She starts her day between 7 - 8 AM.  Daytime sleepiness lasts all day but is worse in the morning.  She had has fallen asleep while using the restroom.  She also has fallen asleep in the middle of conversations.  She has never fallen asleep while driving but has needed to pull over due to fatigue.  She often naps before work in her car in the morning for couple of minutes.  She has 3 children ages 69, 36 and 53.  She works as a Surveyor, [employer].  She suspects she is currently going through menopause.  She has positive RA markers, she has been referred to rheumatology.  Her weight is up 25 pounds in the last 2 years.  Denies cataplexy or sleepwalking.  Sleep questionnaire Symptoms- excessive daytime sleepiness Prior sleep study- 2018, mild OSA in  supine position  Bedtime- 10-11pm Time to fall asleep- several hours Nocturnal awakenings- once  Out of bed/start of day- 7-8am Weight changes- up 25lbs  Do you operate heavy machinery- No  Do you currently wear CPAP- No Do you current wear oxygen- No Epworth- 19  06/20/2022 Patient contacted today to review sleep study results.  She had NPSG on 06/05/2022 that showed evidence of moderate obstructive sleep apnea, AHI 25.8/hour with SPO2 low 80%. No central sleep apneas, cardiac abnormalities or periodic limb movements. Reviewed treatment options including weight loss, oral appliance, CPAP therapy or referral to ENT. Recommending patient be started on CPAP due to severity of OSA and daytime sleepiness. She is open to starting CPAP for management of OSA.   Moderate OSA; - NPSG 06/05/22>> AHI 25.8/hr  - Recommending patient be started on CPAP due to severity of OSA excessive daytime sleepiness - Place an order for patient to be provided with CPAP machine auto titrate 5 to 15 cm H2O with mask of choice. Advised she aim to wear minimum 4-6 hours a night. Encourage weight loss efforts and avoid driving if experiencing excessive daytime sleepiness.   08/15/2022 Patient presents today for 1 to 53-month follow-up. NPSG on 06/05/2022 showed moderate obstructive sleep apnea, AHI 25.8 an hour. Patient was started on auto CPAP 5-15cm h20 due to excessive daytime sleepiness. She feels CPAP has helped her sleep. She feels more rested  in the morning. She gets between 5-7 hours of sleep. She has both full face and nasal pillow mask. Nasal pillow causing some skin irritation to nasal passages.  She has had a congested/productive cough for the last 2-3 weeks with green sputum. She is taking Singulair, Xyzal and Flonase for environmental/seasonal allergy symptoms.    Airview download 07/15/22-08/13/22 Usage 25/20 days; 80% > 4 hours Average usage 5 hours 36 mins Pressure 5-15cm h20 Airleaks 5.2Lmin (95%) AHI 2.4    02/13/2023- Interim hx  Patient presents today for follow-up. NPSG on 06/05/2022 showed moderate obstructive sleep apnea, AHI 25.8 an hour. She was started on CPAP due to excessive daytime sleepiness. She had not been consistently wearing her cpap in the fall, she was traveling to [place] due to her dad being sick. He passed away around the holidays.   She has seen no noticeable benefit when wearing CPAP. She still wakes up with a morning headache and feels tired. She gets 6-7 hours of sleep a night. Typical bedtime is 11:30pm.  She wears invisalign at night. It can take her an hour to fall asleep. She will take 1 tylenol PM at bedtime when needed with good effect.  She also has difficult wearing CPAP when she has upper respiratory symptoms. She deals with a lot of sinus congestion/post nasal drip. Having some issues with mask leaking. She is taking Xyzal and Singulair regularly. She does not use nasal sprays regularly. She has seen an allergist in the past and was told she had several allergies to tree/grass/pollen.   Airview download 01/14/2023 - 02/12/2023 Usage days 10/30 days (47%) greater than 4 hours Average usage days used 6 hours 30 minutes Pressure 5 to 15 cm H2O (10.3 H2O-95%) Air leaks 15.8 L/min (95%) AHI 2.9   Allergies  Allergen Reactions   Doxycycline Nausea Only   Progesterone Other (See Comments)    Insomnia, diarrhea    Immunization History  Administered Date(s) Administered   Influenza Nasal 09/04/2011   Influenza Split 11/23/2015, 12/24/2016, 09/19/2017   Influenza Whole 10/18/2000, 09/16/2001, 10/01/2002   Influenza,inj,Quad PF,6+ Mos 09/13/2017, 09/29/2018, 08/17/2019, 08/31/2020, 09/06/2021   Influenza-Unspecified 10/10/2004, 08/25/2013, 08/16/2014, 08/15/2015, 11/23/2015, 09/19/2017   PFIZER Comirnaty(Gray Top)Covid-19 Tri-Sucrose Vaccine 12/16/2020   PFIZER(Purple Top)SARS-COV-2 Vaccination 03/18/2020, 04/11/2020   Td 04/22/2002   Td (Adult),5 Lf Tetanus  Toxid, Preservative Free 04/22/2002   Td,absorbed, Preservative Free, Adult Use, Lf Unspecified 04/22/2002   Tdap 12/23/2008, 02/27/2021   Typhoid Inactivated 01/26/2009    Past Medical History:  Diagnosis Date   Thyroid disorder    Thyroid levels elevated    Tobacco History: Social History   Tobacco Use  Smoking Status Never  Smokeless Tobacco Never   Counseling given: Not Answered   Outpatient Medications Prior to Visit  Medication Sig Dispense Refill   escitalopram (LEXAPRO) 10 MG tablet Take 10 mg by mouth daily.     fluticasone (FLONASE) 50 MCG/ACT nasal spray Place into the nose.     levothyroxine (SYNTHROID) 100 MCG tablet Take 100 mcg by mouth daily.     montelukast (SINGULAIR) 10 MG tablet Take 10 mg by mouth daily.     valACYclovir (VALTREX) 1000 MG tablet      vitamin B-12 (CYANOCOBALAMIN) 500 MCG tablet Take 500 mcg by mouth daily.     levocetirizine (XYZAL) 5 MG tablet      amoxicillin-clavulanate (AUGMENTIN) 875-125 MG tablet Take 1 tablet by mouth 2 (two) times daily. 10 tablet 0   No facility-administered medications prior to  visit.    Review of Systems  Review of Systems  Constitutional:  Positive for fatigue.  HENT:  Positive for congestion and postnasal drip.   Respiratory: Negative.     Physical Exam  BP 98/62 (BP Location: Left Arm, Patient Position: Sitting, Cuff Size: Normal)   Pulse 83   Ht 5\' 5"  (1.651 m)   Wt 193 lb 3.2 oz (87.6 kg)   SpO2 96%   BMI 32.15 kg/m  Physical Exam Constitutional:      Appearance: Normal appearance.  HENT:     Head: Normocephalic and atraumatic.  Cardiovascular:     Rate and Rhythm: Normal rate and regular rhythm.  Pulmonary:     Effort: Pulmonary effort is normal.     Breath sounds: Normal breath sounds.  Musculoskeletal:        General: Normal range of motion.  Skin:    General: Skin is warm and dry.  Neurological:     General: No focal deficit present.     Mental Status: She is alert and  oriented to person, place, and time. Mental status is at baseline.  Psychiatric:        Mood and Affect: Mood normal.        Behavior: Behavior normal.        Thought Content: Thought content normal.        Judgment: Judgment normal.      Lab Results:  CBC    Component Value Date/Time   WBC 8.0 01/05/2020 1759   RBC 5.26 (H) 01/05/2020 1759   HGB 14.5 01/05/2020 1759   HCT 42.5 01/05/2020 1759   PLT 261 01/05/2020 1759   MCV 80.8 01/05/2020 1759   MCH 27.6 01/05/2020 1759   MCHC 34.1 01/05/2020 1759   RDW 12.2 01/05/2020 1759   LYMPHSABS 2.5 01/05/2020 1759   MONOABS 0.8 01/05/2020 1759   EOSABS 0.1 01/05/2020 1759   BASOSABS 0.0 01/05/2020 1759    BMET    Component Value Date/Time   NA 139 01/05/2020 1759   K 3.9 01/05/2020 1759   CL 104 01/05/2020 1759   CO2 26 01/05/2020 1759   GLUCOSE 89 01/05/2020 1759   BUN 14 01/05/2020 1759   CREATININE 0.93 01/05/2020 1759   CALCIUM 9.5 01/05/2020 1759   GFRNONAA >60 01/05/2020 1759   GFRAA >60 01/05/2020 1759    BNP No results found for: "BNP"  ProBNP No results found for: "PROBNP"  Imaging: No results found.   Assessment & Plan:   Sleep apnea - NPSG 06/05/2022 showed moderate OSA, AHI 25.8 an hour.  Patient was started on CPAP in August 2023 due to excessive daytime sleepiness.  She has not noticed much clinical benefit since starting CPAP.  She continues to have morning headaches and fatigue symptoms. She recently has been struggling with compliance due to life circumstances.  Current pressure 5 to 15 cm H2O; residual AHI 2.9 an hour.  Advised patient be more consistent with CPAP use, encouraged she wear nightly for 6 hours or longer.  Hypothyroidism and chronic sinusitis likely tripping factors to headache and fatigue symptoms. Encourage daily naps with CPAP. We could consider adding medication for hypersomnia such as Provigil at follow-up if symptoms persist.  Seasonal allergic rhinitis - Poorly controlled; She  is experiencing PND symptoms, morning headache along with fatigue symptoms. Advised she start back fluticasone nasal spray.  Recommend changing from Levocetirizine to Loratadine 10mg  daily. Continue Singulair 10mg  at bedtime. If not better would return to see allergist.  Hypothyroid - TSH in October 2023 was 1.36 - Continue Synthroid 14mcgf daily - She is potentially interested in [facility], advised she speak with PCP for referral   Martyn Ehrich, NP 02/13/2023

## 2023-05-17 ENCOUNTER — Ambulatory Visit (INDEPENDENT_AMBULATORY_CARE_PROVIDER_SITE_OTHER): Admitting: Primary Care

## 2023-05-17 ENCOUNTER — Encounter: Payer: Self-pay | Admitting: Primary Care

## 2023-05-17 VITALS — BP 110/68 | HR 82 | Temp 98.4°F | Ht 65.0 in | Wt 198.4 lb

## 2023-05-17 DIAGNOSIS — G4733 Obstructive sleep apnea (adult) (pediatric): Secondary | ICD-10-CM | POA: Diagnosis not present

## 2023-05-17 DIAGNOSIS — R42 Dizziness and giddiness: Secondary | ICD-10-CM | POA: Diagnosis not present

## 2023-05-17 DIAGNOSIS — R5382 Chronic fatigue, unspecified: Secondary | ICD-10-CM

## 2023-05-17 NOTE — Assessment & Plan Note (Addendum)
-   NPSG 06/05/2022 showed moderate OSA, AHI 25.8 an hour. She was started on CPAP in August 2023 due to excessive daytime sleepiness. Sleep apnea is well controlled on current CPAP pressure settings. Pressure 5-15cm h20 (10.3cm h20-95%); residual AHI 3.8. She is having some airleaks, getting replacement mask. She continues to have persistent fatigue, recommending working this up further as unclear if from OSA. We will check echocardiogram d/t fatigue, dizziness and family hx valvular dysfunction. She has also had positive serology testing in the past, recommend repeating. If testing normal, we can try something like Provigil to help with daytime sleepiness. Would appreciate cardiology's input if this is ok.

## 2023-05-17 NOTE — Patient Instructions (Addendum)
  Sleep apnea is well controlled on current CPAP pressure settings Persistent fatigue, working up  Recommendations: - Continue to wear CPAP nightly  - Look at getting medcline wedge pillow for side sleepers  - Send me a Clinical cytogeneticist message after you see cardiologist, if they are ok with you being started on medication to help with fatigue called provigil/modafinil which is a stimulant like medication let me know and we can send in prescription  Orders: Change cpap pressure 10cm h20  Echocardiogram re: fatigue/lightheadedness  Labs today re: fatigue/previously positive RF   Follow-up: 6 months with Beth NP / or sooner if we

## 2023-05-17 NOTE — Progress Notes (Unsigned)
@Patient  ID: Dawn Delacruz, female    DOB: 12-21-1970, 52 y.o.   MRN: [id number]  No chief complaint on file.   Referring provider: Roger Kill, *  HPI: 52 year old female, never smoked.  Past medical history significant for hypothyroidism, palpitations, hypersomnolence.  Former patient of Dr. Vassie Loll.  Previous [facility] pulmonary encounter: 04/04/2022- Consult  Patient presents today for sleep consult.  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  The sleep results did not explain patient's excessive sleepiness, Dr. Vassie Loll suggested in lab sleep study followed by multi sleep latency test to rule out narcolepsy.  At that time patient did not wish to proceed to additional testing. She has symptoms of excessive daytime sleepiness and insomnia.  She is unsure if she snores.  Her husband snores loudly which does not help her ability to initiate sleep.  She is tried melatonin but this makes her groggy.  Typical bedtime is between 10 and 11 PM.  Typically sleeps on her side.  She wears Invisalign retainers at night.  It can take her several hours to fall asleep.  She wakes up on average once to use the restroom.  She starts her day between 7 - 8 AM.  Daytime sleepiness lasts all day but is worse in the morning.  She had has fallen asleep while using the restroom.  She also has fallen asleep in the middle of conversations.  She has never fallen asleep while driving but has needed to pull over due to fatigue.  She often naps before work in her car in the morning for couple of minutes.  She has 3 children ages 9, 48 and 59.  She works as a Social worker.  She suspects she is currently going through menopause.  She has positive RA markers, she has been referred to rheumatology.  Her weight is up 25 pounds in the last 2 years.  Denies cataplexy or sleepwalking.  Sleep questionnaire Symptoms- excessive daytime sleepiness Prior sleep study- 2018, mild OSA in supine position   Bedtime- 10-11pm Time to fall asleep- several hours Nocturnal awakenings- once  Out of bed/start of day- 7-8am Weight changes- up 25lbs  Do you operate heavy machinery- No  Do you currently wear CPAP- No Do you current wear oxygen- No Epworth- 19   02/13/2023 Patient presents today for follow-up. NPSG on 06/05/2022 showed moderate obstructive sleep apnea, AHI 25.8 an hour. She was started on CPAP due to excessive daytime sleepiness. She had not been consistently wearing her cpap in the fall, she was traveling to [place] due to her dad being sick. He passed away around the holidays.   She has seen no noticeable benefit when wearing CPAP. She still wakes up with a morning headache and feels tired. She gets 6-7 hours of sleep a night. Typical bedtime is 11:30pm.  She wears invisalign at night. It can take her an hour to fall asleep. She will take 1 tylenol PM at bedtime when needed with good effect.  She also has difficult wearing CPAP when she has upper respiratory symptoms. She deals with a lot of sinus congestion/post nasal drip. Having some issues with mask leaking. She is taking Xyzal and Singulair regularly. She does not use nasal sprays regularly. She has seen an allergist in the past and was told she had several allergies to tree/grass/pollen.   Airview download 01/14/2023 - 02/12/2023 Usage days 10/30 days (47%) greater than 4 hours Average usage days used 6 hours 30  minutes Pressure 5 to 15 cm H2O (10.3 H2O-95%) Air leaks 15.8 L/min (95%) AHI 2.9  NPSG 06/05/2022 showed moderate OSA, AHI 25.8 an hour.  Patient was started on CPAP in August 2023 due to excessive daytime sleepiness.  She has not noticed much clinical benefit since starting CPAP.  She continues to have morning headaches and fatigue symptoms. She recently has been struggling with compliance due to life circumstances.  Current pressure 5 to 15 cm H2O; residual AHI 2.9 an hour.  Advised patient be more consistent with CPAP  use, encouraged she wear nightly for 6 hours or longer.  Hypothyroidism and chronic sinusitis likely tripping factors to headache and fatigue symptoms. Encourage daily naps with CPAP. We could consider adding medication for hypersomnia such as Provigil at follow-up if symptoms persist.   05/17/2023 - Interim  Patient presents today for 3 months fu OSA. During our last office visit patient reported persistent symptoms of morning headaches and fatigue symptoms.  She was struggling during that overview with CPAP compliance due to life circumstances.  We reviewed importance of daily usage. Chronic sinusitis and hypothyroidism likely contributing factors to headache and fatigue symptoms.  She goes to bed at 10, falls  She is sleeping pretty well into the morning  Started her day between 7-7:35 She has a new CPAP mask being delivered  She starts off sleeping on her side but will end up on her back  Denies chest pain or shortness of breath    Morning headaches and fatigue? >> Bed time is  >> Starts her day at  >> Time to fall asleep  >> Epworth score  >> TSH level 2.105 in May  >> neuro vs ENT for headache and sinusitis  >> Provigil   Airview download 04/16/23- 05/15/23 Usage 23/30 days; 22 days 73% Average usage 5 hours 3 mins Pressure 5-15cm h20 (10.3cm h20-95%) Airleaks 22Lmin AHI 3.8   Allergies  Allergen Reactions   Doxycycline Nausea Only   Progesterone Other (See Comments)    Insomnia, diarrhea    Immunization History  Administered Date(s) Administered   Influenza Nasal 09/04/2011   Influenza Split 11/23/2015, 12/24/2016, 09/19/2017   Influenza Whole 10/18/2000, 09/16/2001, 10/01/2002   Influenza,inj,Quad PF,6+ Mos 09/13/2017, 09/29/2018, 08/17/2019, 08/31/2020, 09/06/2021   Influenza-Unspecified 10/10/2004, 08/25/2013, 08/16/2014, 08/15/2015, 11/23/2015, 09/19/2017   PFIZER Comirnaty(Gray Top)Covid-19 Tri-Sucrose Vaccine 12/16/2020   PFIZER(Purple Top)SARS-COV-2 Vaccination  03/18/2020, 04/11/2020   Td 04/22/2002   Td (Adult),5 Lf Tetanus Toxid, Preservative Free 04/22/2002   Td,absorbed, Preservative Free, Adult Use, Lf Unspecified 04/22/2002   Tdap 12/23/2008, 02/27/2021   Typhoid Inactivated 01/26/2009    Past Medical History:  Diagnosis Date   Thyroid disorder    Thyroid levels elevated    Tobacco History: Social History   Tobacco Use  Smoking Status Never  Smokeless Tobacco Never   Counseling given: Not Answered   Outpatient Medications Prior to Visit  Medication Sig Dispense Refill   amoxicillin-clavulanate (AUGMENTIN) 875-125 MG tablet Take 1 tablet by mouth 2 (two) times daily. 10 tablet 0   escitalopram (LEXAPRO) 10 MG tablet Take 10 mg by mouth daily.     fluticasone (FLONASE) 50 MCG/ACT nasal spray Place into the nose.     levothyroxine (SYNTHROID) 100 MCG tablet Take 100 mcg by mouth daily.     loratadine (CLARITIN) 10 MG tablet Take 1 tablet (10 mg total) by mouth daily. 30 tablet 3   montelukast (SINGULAIR) 10 MG tablet Take 10 mg by mouth daily.  valACYclovir (VALTREX) 1000 MG tablet      vitamin B-12 (CYANOCOBALAMIN) 500 MCG tablet Take 500 mcg by mouth daily.     No facility-administered medications prior to visit.      Review of Systems  Review of Systems  Constitutional:  Positive for fatigue.  HENT:  Negative for congestion.   Respiratory: Negative.    Cardiovascular: Negative.   Neurological:  Positive for light-headedness.  Psychiatric/Behavioral:  Positive for sleep disturbance.      Physical Exam  There were no vitals taken for this visit. Physical Exam Constitutional:      Appearance: Normal appearance.  HENT:     Head: Normocephalic and atraumatic.  Cardiovascular:     Rate and Rhythm: Normal rate and regular rhythm.  Pulmonary:     Effort: Pulmonary effort is normal.     Breath sounds: Normal breath sounds.  Musculoskeletal:        General: Normal range of motion.  Skin:    General: Skin is  warm and dry.  Neurological:     General: No focal deficit present.     Mental Status: She is alert and oriented to person, place, and time. Mental status is at baseline.  Psychiatric:        Mood and Affect: Mood normal.        Behavior: Behavior normal.        Thought Content: Thought content normal.        Judgment: Judgment normal.      Lab Results:  CBC    Component Value Date/Time   WBC 8.0 01/05/2020 1759   RBC 5.26 (H) 01/05/2020 1759   HGB 14.5 01/05/2020 1759   HCT 42.5 01/05/2020 1759   PLT 261 01/05/2020 1759   MCV 80.8 01/05/2020 1759   MCH 27.6 01/05/2020 1759   MCHC 34.1 01/05/2020 1759   RDW 12.2 01/05/2020 1759   LYMPHSABS 2.5 01/05/2020 1759   MONOABS 0.8 01/05/2020 1759   EOSABS 0.1 01/05/2020 1759   BASOSABS 0.0 01/05/2020 1759    BMET    Component Value Date/Time   NA 139 01/05/2020 1759   K 3.9 01/05/2020 1759   CL 104 01/05/2020 1759   CO2 26 01/05/2020 1759   GLUCOSE 89 01/05/2020 1759   BUN 14 01/05/2020 1759   CREATININE 0.93 01/05/2020 1759   CALCIUM 9.5 01/05/2020 1759   GFRNONAA >60 01/05/2020 1759   GFRAA >60 01/05/2020 1759    BNP No results found for: "BNP"  ProBNP No results found for: "PROBNP"  Imaging: No results found.   Assessment & Plan:   No problem-specific Assessment & Plan notes found for this encounter.     Glenford Bayley, NP 05/17/2023

## 2023-05-18 LAB — IRON,TIBC AND FERRITIN PANEL: TIBC: 340 mcg/dL (calc) (ref 250–450)

## 2023-05-20 LAB — ANA: Anti Nuclear Antibody (ANA): NEGATIVE

## 2023-05-20 LAB — IRON,TIBC AND FERRITIN PANEL
%SAT: 12 % (calc) — ABNORMAL LOW (ref 16–45)
Ferritin: 37 ng/mL (ref 16–232)
Iron: 41 ug/dL — ABNORMAL LOW (ref 45–160)

## 2023-05-20 LAB — RHEUMATOID FACTOR: Rheumatoid fact SerPl-aCnc: <10 IU/mL (ref <14)

## 2023-05-20 NOTE — Progress Notes (Signed)
Please let patient know rheumatoid factor and ANA test (autoimmune) were negative. Iron is low- have her follow-up with PCP for management of this

## 2023-06-21 ENCOUNTER — Ambulatory Visit (HOSPITAL_COMMUNITY)

## 2023-07-18 ENCOUNTER — Ambulatory Visit (HOSPITAL_COMMUNITY): Attending: Primary Care

## 2023-07-18 DIAGNOSIS — R42 Dizziness and giddiness: Secondary | ICD-10-CM | POA: Diagnosis not present

## 2023-07-18 DIAGNOSIS — G4733 Obstructive sleep apnea (adult) (pediatric): Secondary | ICD-10-CM

## 2023-07-18 DIAGNOSIS — R5382 Chronic fatigue, unspecified: Secondary | ICD-10-CM | POA: Diagnosis not present

## 2023-07-18 LAB — ECHOCARDIOGRAM COMPLETE
Area-P 1/2: 3.39 cm2
S' Lateral: 3 cm

## 2023-07-23 ENCOUNTER — Encounter: Attending: Family Medicine | Admitting: Dietician

## 2023-07-23 ENCOUNTER — Encounter: Payer: Self-pay | Admitting: Dietician

## 2023-07-23 VITALS — Ht 65.0 in | Wt 202.7 lb

## 2023-07-23 DIAGNOSIS — E669 Obesity, unspecified: Secondary | ICD-10-CM | POA: Diagnosis present

## 2023-07-23 DIAGNOSIS — E611 Iron deficiency: Secondary | ICD-10-CM | POA: Diagnosis present

## 2023-07-23 DIAGNOSIS — Z713 Dietary counseling and surveillance: Secondary | ICD-10-CM | POA: Insufficient documentation

## 2023-07-23 DIAGNOSIS — D509 Iron deficiency anemia, unspecified: Secondary | ICD-10-CM

## 2023-07-23 DIAGNOSIS — Z6833 Body mass index (BMI) 33.0-33.9, adult: Secondary | ICD-10-CM | POA: Diagnosis not present

## 2023-07-23 NOTE — Progress Notes (Signed)
Medical Nutrition Therapy  Appointment Start time:  1520  Appointment End time:  1620  Primary concerns today: pt states she wants to learn how to eat.    Referral diagnosis: iron deficiency, E66.9 Preferred learning style: no preference indicated Learning readiness: ready   NUTRITION ASSESSMENT   Anthropometrics  Ht: 65 in Wt: 202.7 lb  Clinical Medical Hx: thyroid disease, iron deficiency Medications: levothyroxine Labs: iron 41, %SAT 12. Notable Signs/Symptoms: none reported  Food Allergies: none reported  Lifestyle & Dietary Hx  Pt states she has never been this heavy and states she gained 40 lb during menopause starting around 2 years ago.   Pt reports she has history of weight watchers, and states she would lose 10 lb, then gain it back plus some, etc.  Pt states she has bowel movement 4x/day. She states this has always been normal for her.    Pt reports she used to drink coke, then switched to coke zero, but eventually cut that out.    Pt started c-pap for sleep apnea. She states she feels like it helps but she states she is still tired during the day and states she gets to work 10 min early so that she is able to sleep in the car for 10 min.   Pt is considering starting wegovy.   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 She states some days she takes the kids walking.   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   24-Hr Dietary Recall First Meal: 8am: 2 pieces whole wheat toast (low calorie) with peanut butter and banana Snack: 10am: cheese stick and fruit (variety) Second Meal: premade salad [employer] (apple, lettuce, carrots, chicken, blue cheese, cranberries, nuts)  Snack: fruit and peanut butter or cheese OR smoothie (fruit, milk, collagen) Third Meal: PBJ OR meat,  mac and cheese, and vegetable Snack: something sweet (1 individual m&ms or couple bites of cake) Beverages: 1 c english breakfast tea with skim milk, 80 oz water, sometimes zero sugar ginger ale   NUTRITION DIAGNOSIS  Patrick-2.2 Altered nutrition-related laboratory As related to iron deficiency.  As evidenced by iron 41.   NUTRITION INTERVENTION  Nutrition education (E-1) on the following topics:  Fruits & Vegetables: Aim to fill half your plate with a variety of fruits and vegetables. They are rich in vitamins, minerals, and fiber, and can help reduce the risk of chronic diseases. Choose a colorful assortment of fruits and vegetables to ensure you get a wide range of nutrients. Grains and Starches: Make at least half of your grain choices whole grains, such as brown rice, whole wheat bread, and oats. Whole grains provide fiber, which aids in digestion and healthy cholesterol levels. Aim for whole forms of starchy vegetables such as potatoes, sweet potatoes, beans, peas, and corn, which are fiber rich and provide many vitamins and minerals.  Protein: Incorporate lean sources of protein, such as poultry, fish, beans, nuts, and seeds, into your meals. Protein is essential for building and repairing tissues, staying full, balancing blood sugar, as well as supporting immune function. Dairy: Include low-fat or fat-free dairy products like milk, yogurt, and cheese in your diet. Dairy foods are excellent sources of calcium and vitamin D, which are crucial for bone health.  Physical Activity: Aim for 60 minutes of physical activity daily. Regular  physical activity promotes overall health-including helping to reduce risk for heart disease and diabetes, promoting mental health, and helping [place] sleep better.  Iron from meat, fish, and poultry is better absorbed than iron from plants. Include foods high in vitamin C such as citrus juice and fruits, melons, dark green leafy vegetables, and potatoes with your meals.  This may help your body absorb more iron. Eat enriched or fortified grain products. Continue with prenatal vitamin.   Handouts Provided Include  Plate Method  Learning Style & Readiness for Change Teaching method utilized: Visual & Auditory  Demonstrated degree of understanding via: Teach Back  Barriers to learning/adherence to lifestyle change: none  Goals Established by Pt  Aim for at least 150 minutes of physical activity weekly. Goal: go on three 40 minute walks per week, and go walking with husband 1x/wk for 30 minutes.   Goal: do some strength training once a week for 15 minutes.   Try natural peanut butter (jif natural has the palm oil, smuckers natural is just peanuts and salt).   At meals, aim to include 1/2 plate non-starchy vegetables, 1/4 plate protein, and 1/4 plate complex carbs.    MONITORING & EVALUATION Dietary intake, weekly physical activity, and follow up in 2 months.  Next Steps  Patient is to call for questions.

## 2023-07-23 NOTE — Patient Instructions (Addendum)
Aim for at least 150 minutes of physical activity weekly. Goal: go on three 40 minute walks per week, and go walking with husband 1x/wk for 30 minutes.   Goal: do some strength training once a week for 15 minutes.   Try natural peanut butter (jif natural has the palm oil, smuckers natural is just peanuts and salt).   At meals, aim to include 1/2 plate non-starchy vegetables, 1/4 plate protein, and 1/4 plate complex carbs.

## 2023-07-25 ENCOUNTER — Other Ambulatory Visit (HOSPITAL_COMMUNITY): Payer: Self-pay

## 2023-07-25 MED ORDER — WEGOVY 0.25 MG/0.5ML ~~LOC~~ SOAJ
SUBCUTANEOUS | 0 refills | Status: DC
  Filled 2023-07-25: qty 2, 28d supply, fill #0

## 2023-08-01 ENCOUNTER — Other Ambulatory Visit (HOSPITAL_COMMUNITY): Payer: Self-pay

## 2023-08-16 ENCOUNTER — Ambulatory Visit (INDEPENDENT_AMBULATORY_CARE_PROVIDER_SITE_OTHER): Admitting: Primary Care

## 2023-08-16 ENCOUNTER — Encounter: Payer: Self-pay | Admitting: Primary Care

## 2023-08-16 VITALS — BP 108/70 | HR 80 | Wt 195.0 lb

## 2023-08-16 DIAGNOSIS — G4730 Sleep apnea, unspecified: Secondary | ICD-10-CM

## 2023-08-16 NOTE — Patient Instructions (Addendum)
Sleep apnea is well-controlled on current pressure setting of 10 cm H2O No changes recommended today Aim to wear CPAP nightly 4-6 hours  Continue multivitamin BP recheck 108/70  Follow-up 1 year with Beth NP (virtual or in person ok)   CPAP and BIPAP Information CPAP and BIPAP are methods that use air pressure to keep your airways open and to help you breathe well. CPAP and BIPAP use different amounts of pressure. Your health care provider will tell you whether CPAP or BIPAP would be more helpful for you. CPAP stands for "continuous positive airway pressure." With CPAP, the amount of pressure stays the same while you breathe in (inhale) and out (exhale). BIPAP stands for "bi-level positive airway pressure." With BIPAP, the amount of pressure will be higher when you inhale and lower when you exhale. This allows you to take larger breaths. CPAP or BIPAP may be used in the hospital, or your health care provider may want you to use it at home. You may need to have a sleep study before your health care provider can order a machine for you to use at home. What are the advantages? CPAP or BIPAP can be helpful if you have: Sleep apnea. Chronic obstructive pulmonary disease (COPD). Heart failure. Medical conditions that cause muscle weakness, including muscular dystrophy or amyotrophic lateral sclerosis (ALS). Other problems that cause breathing to be shallow, weak, abnormal, or difficult. CPAP and BIPAP are most commonly used for obstructive sleep apnea (OSA) to keep the airways from collapsing when the muscles relax during sleep. What are the risks? Generally, this is a safe treatment. However, problems may occur, including: Irritated skin or skin sores if the mask does not fit properly. Dry or stuffy nose or nosebleeds. Dry mouth. Feeling gassy or bloated. Sinus or lung infection if the equipment is not cleaned properly. When should CPAP or BIPAP be used? In most cases, the mask only needs to  be worn during sleep. Generally, the mask needs to be worn throughout the night and during any daytime naps. People with certain medical conditions may also need to wear the mask at other times, such as when they are awake. Follow instructions from your health care provider about when to use the machine. What happens during CPAP or BIPAP?  Both CPAP and BIPAP are provided by a small machine with a flexible plastic tube that attaches to a plastic mask that you wear. Air is blown through the mask into your nose or mouth. The amount of pressure that is used to blow the air can be adjusted on the machine. Your health care provider will set the pressure setting and help you find the best mask for you. Tips for using the mask Because the mask needs to be snug, some people feel trapped or closed-in (claustrophobic) when first using the mask. If you feel this way, you may need to get used to the mask. One way to do this is to hold the mask loosely over your nose or mouth and then gradually apply the mask more snugly. You can also gradually increase the amount of time that you use the mask. Masks are available in various types and sizes. If your mask does not fit well, talk with your health care provider about getting a different one. Some common types of masks include: Full face masks, which fit over the mouth and nose. Nasal masks, which fit over the nose. Nasal pillow or prong masks, which fit into the nostrils. If you are using a  mask that fits over your nose and you tend to breathe through your mouth, a chin strap may be applied to help keep your mouth closed. Use a skin barrier to protect your skin as told by your health care provider. Some CPAP and BIPAP machines have alarms that may sound if the mask comes off or develops a leak. If you have trouble with the mask, it is very important that you talk with your health care provider about finding a way to make the mask easier to tolerate. Do not stop using  the mask. There could be a negative impact on your health if you stop using the mask. Tips for using the machine Place your CPAP or BIPAP machine on a secure table or stand near an electrical outlet. Know where the on/off switch is on the machine. Follow instructions from your health care provider about how to set the pressure on your machine and when you should use it. Do not eat or drink while the CPAP or BIPAP machine is on. Food or fluids could get pushed into your lungs by the pressure of the CPAP or BIPAP. For home use, CPAP and BIPAP machines can be rented or purchased through home health care companies. Many different brands of machines are available. Renting a machine before purchasing may help you find out which particular machine works well for you. Your health insurance company may also decide which machine you may get. Keep the CPAP or BIPAP machine and attachments clean. Ask your health care provider for specific instructions. Check the humidifier if you have a dry stuffy nose or nosebleeds. Make sure it is working correctly. Follow these instructions at home: Take over-the-counter and prescription medicines only as told by your health care provider. Ask if you can take sinus medicine if your sinuses are blocked. Do not use any products that contain nicotine or tobacco. These products include cigarettes, chewing tobacco, and vaping devices, such as e-cigarettes. If you need help quitting, ask your health care provider. Keep all follow-up visits. This is important. Contact a health care provider if: You have redness or pressure sores on your head, face, mouth, or nose from the mask or head gear. You have trouble using the CPAP or BIPAP machine. You cannot tolerate wearing the CPAP or BIPAP mask. Someone tells you that you snore even when wearing your CPAP or BIPAP. Get help right away if: You have trouble breathing. You feel confused. Summary CPAP and BIPAP are methods that use air  pressure to keep your airways open and to help you breathe well. If you have trouble with the mask, it is very important that you talk with your health care provider about finding a way to make the mask easier to tolerate. Do not stop using the mask. There could be a negative impact to your health if you stop using the mask. Follow instructions from your health care provider about when to use the machine. This information is not intended to replace advice given to you by your health care provider. Make sure you discuss any questions you have with your health care provider. Document Revised: 06/14/2021 Document Reviewed: 10/14/2020 Elsevier Patient Education  2023 [employer].

## 2023-08-16 NOTE — Progress Notes (Unsigned)
@Patient  ID: Dawn Delacruz, female    DOB: 10-01-1971, 52 y.o.   MRN: [id number]  No chief complaint on file.   Referring provider: Roger Kill, *  HPI: 52 year old female, never smoked.  Past medical history significant for hypothyroidism, palpitations, hypersomnolence.  Former patient of Dr. Vassie Loll.  Previous [facility] pulmonary encounter: 04/04/2022- Consult  Patient presents today for sleep consult.  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  The sleep results did not explain patient's excessive sleepiness, Dr. Vassie Loll suggested in lab sleep study followed by multi sleep latency test to rule out narcolepsy.  At that time patient did not wish to proceed to additional testing. She has symptoms of excessive daytime sleepiness and insomnia.  She is unsure if she snores.  Her husband snores loudly which does not help her ability to initiate sleep.  She is tried melatonin but this makes her groggy.  Typical bedtime is between 10 and 11 PM.  Typically sleeps on her side.  She wears Invisalign retainers at night.  It can take her several hours to fall asleep.  She wakes up on average once to use the restroom.  She starts her day between 7 - 8 AM.  Daytime sleepiness lasts all day but is worse in the morning.  She had has fallen asleep while using the restroom.  She also has fallen asleep in the middle of conversations.  She has never fallen asleep while driving but has needed to pull over due to fatigue.  She often naps before work in her car in the morning for couple of minutes.  She has 3 children ages 67, 17 and 31.  She works as a Social worker.  She suspects she is currently going through menopause.  She has positive RA markers, she has been referred to rheumatology.  Her weight is up 25 pounds in the last 2 years.  Denies cataplexy or sleepwalking.  Sleep questionnaire Symptoms- excessive daytime sleepiness Prior sleep study- 2018, mild OSA in supine position   Bedtime- 10-11pm Time to fall asleep- several hours Nocturnal awakenings- once  Out of bed/start of day- 7-8am Weight changes- up 25lbs  Do you operate heavy machinery- No  Do you currently wear CPAP- No Do you current wear oxygen- No Epworth- 19   02/13/2023 Patient presents today for follow-up. NPSG on 06/05/2022 showed moderate obstructive sleep apnea, AHI 25.8 an hour. She was started on CPAP due to excessive daytime sleepiness. She had not been consistently wearing her cpap in the fall, she was traveling to [place] due to her dad being sick. He passed away around the holidays.   She has seen no noticeable benefit when wearing CPAP. She still wakes up with a morning headache and feels tired. She gets 6-7 hours of sleep a night. Typical bedtime is 11:30pm.  She wears invisalign at night. It can take her an hour to fall asleep. She will take 1 tylenol PM at bedtime when needed with good effect.  She also has difficult wearing CPAP when she has upper respiratory symptoms. She deals with a lot of sinus congestion/post nasal drip. Having some issues with mask leaking. She is taking Xyzal and Singulair regularly. She does not use nasal sprays regularly. She has seen an allergist in the past and was told she had several allergies to tree/grass/pollen.   Airview download 01/14/2023 - 02/12/2023 Usage days 10/30 days (47%) greater than 4 hours Average usage days used 6 hours 30  minutes Pressure 5 to 15 cm H2O (10.3 H2O-95%) Air leaks 15.8 L/min (95%) AHI 2.9  NPSG 06/05/2022 showed moderate OSA, AHI 25.8 an hour.  Patient was started on CPAP in August 2023 due to excessive daytime sleepiness.  She has not noticed much clinical benefit since starting CPAP.  She continues to have morning headaches and fatigue symptoms. She recently has been struggling with compliance due to life circumstances.  Current pressure 5 to 15 cm H2O; residual AHI 2.9 an hour.  Advised patient be more consistent with CPAP  use, encouraged she wear nightly for 6 hours or longer.  Hypothyroidism and chronic sinusitis likely tripping factors to headache and fatigue symptoms. Encourage daily naps with CPAP. We could consider adding medication for hypersomnia such as Provigil at follow-up if symptoms persist.   05/17/2023 Patient presents today for 3 months fu OSA. During our last office visit patient reported persistent symptoms of morning headaches and fatigue symptoms.  She was struggling during that overview with CPAP compliance due to life circumstances.  We reviewed importance of daily usage. Chronic sinusitis and hypothyroidism likely contributing factors to headache and fatigue symptoms.  She is moderately compliant with CPAP but has notice no significant clinical benefit in fatigue symptoms. Headaches have improved. Fatigue is fairly debilitating. TSH level 2.105 in May. She goes to bed at 10, falls asleep without much issue. She is sleeping pretty well through the night. Starts her day between 7-7:35am. She has a new CPAP mask being delivered shortly. She starts off sleeping on her side but will end up on her back. Denies chest pain or shortness of breath.   Airview download 04/16/23- 05/15/23 Usage 23/30 days; 22 days 73% Average usage 5 hours 3 mins Pressure 5-15cm h20 (10.3cm h20-95%) Airleaks 22Lmin AHI 3.8  08/16/2023- Interim hx  Patient presents today for OSA follow-up. NPSG 06/05/2022 showed moderate OSA, AHI 25.8 an hour.  Patient was started on CPAP in August 2023 due to excessive daytime sleepiness.  She has not noticed much clinical benefit since starting CPAP.  At previous visits patient has reported symptoms of excessive fatigue/ morning headache. She has struggled in the past with CPAP compliance. Iron level was low, advised to follow-up with primary care. She has been taking pre-natal multi  Her fatigue has been much better. Headaches have not been an issues She was able to see nutritionist  She  has had some breaks in therapy dur to vacation, dental work or acute illness. She received new mask, using nasal mask.      Airview download 07/16/2023 - 08/14/2023 Average usage 20/30 days (67%): 19 days (67%) greater than 4 hours Average usage days used 6 hours 23 minutes Pressure 10 cm H2O Air leaks 12.5 L/min (95%) AHI 3.0   Allergies  Allergen Reactions   Doxycycline Nausea Only   Progesterone Other (See Comments)    Insomnia, diarrhea    Immunization History  Administered Date(s) Administered   Influenza Nasal 09/04/2011   Influenza Split 11/23/2015, 12/24/2016, 09/19/2017   Influenza Whole 10/18/2000, 09/16/2001, 10/01/2002   Influenza,inj,Quad PF,6+ Mos 09/13/2017, 09/29/2018, 08/17/2019, 08/31/2020, 09/06/2021   Influenza-Unspecified 10/10/2004, 08/25/2013, 08/16/2014, 08/15/2015, 11/23/2015, 09/19/2017   PFIZER Comirnaty(Gray Top)Covid-19 Tri-Sucrose Vaccine 12/16/2020   PFIZER(Purple Top)SARS-COV-2 Vaccination 03/18/2020, 04/11/2020   Td 04/22/2002   Td (Adult),5 Lf Tetanus Toxid, Preservative Free 04/22/2002   Td,absorbed, Preservative Free, Adult Use, Lf Unspecified 04/22/2002   Tdap 12/23/2008, 02/27/2021   Typhoid Inactivated 01/26/2009    Past Medical History:  Diagnosis Date  Thyroid disorder    Thyroid levels elevated    Tobacco History: Social History   Tobacco Use  Smoking Status Never  Smokeless Tobacco Never   Counseling given: Not Answered   Outpatient Medications Prior to Visit  Medication Sig Dispense Refill   amoxicillin-clavulanate (AUGMENTIN) 875-125 MG tablet Take 1 tablet by mouth 2 (two) times daily. (Patient not taking: Reported on 05/17/2023) 10 tablet 0   escitalopram (LEXAPRO) 10 MG tablet Take 10 mg by mouth daily.     fluticasone (FLONASE) 50 MCG/ACT nasal spray Place into the nose.     levothyroxine (SYNTHROID) 100 MCG tablet Take 100 mcg by mouth daily.     loratadine (CLARITIN) 10 MG tablet Take 1 tablet (10 mg total) by  mouth daily. 30 tablet 3   montelukast (SINGULAIR) 10 MG tablet Take 10 mg by mouth daily.     valACYclovir (VALTREX) 1000 MG tablet      vitamin B-12 (CYANOCOBALAMIN) 500 MCG tablet Take 500 mcg by mouth daily.     No facility-administered medications prior to visit.      Review of Systems  Review of Systems   Physical Exam  There were no vitals taken for this visit. Physical Exam   Lab Results:  CBC    Component Value Date/Time   WBC 8.0 01/05/2020 1759   RBC 5.26 (H) 01/05/2020 1759   HGB 14.5 01/05/2020 1759   HCT 42.5 01/05/2020 1759   PLT 261 01/05/2020 1759   MCV 80.8 01/05/2020 1759   MCH 27.6 01/05/2020 1759   MCHC 34.1 01/05/2020 1759   RDW 12.2 01/05/2020 1759   LYMPHSABS 2.5 01/05/2020 1759   MONOABS 0.8 01/05/2020 1759   EOSABS 0.1 01/05/2020 1759   BASOSABS 0.0 01/05/2020 1759    BMET    Component Value Date/Time   NA 139 01/05/2020 1759   K 3.9 01/05/2020 1759   CL 104 01/05/2020 1759   CO2 26 01/05/2020 1759   GLUCOSE 89 01/05/2020 1759   BUN 14 01/05/2020 1759   CREATININE 0.93 01/05/2020 1759   CALCIUM 9.5 01/05/2020 1759   GFRNONAA >60 01/05/2020 1759   GFRAA >60 01/05/2020 1759    BNP No results found for: "BNP"  ProBNP No results found for: "PROBNP"  Imaging: ECHOCARDIOGRAM COMPLETE  Result Date: 07/18/2023    ECHOCARDIOGRAM REPORT   Patient Name:   [facility] Date of Exam: 07/18/2023 Medical Rec #:  [id number]      Height:       65.0 in Accession #:    [id number]     Weight:       198.4 lb Date of Birth:  1971/03/09       BSA:          1.971 m Patient Age:    52 years       BP:           104/76 mmHg Patient Gender: F              HR:           69 bpm. Exam Location:  [address] Procedure: 2D Echo, Cardiac Doppler, Color Doppler and Strain Analysis Indications:    R42 Lightheadedness                 R53.82 Fatigue  History:        Patient has no prior history of Echocardiogram examinations.  Obstructive sleep  apnea; Signs/Symptoms:Fatigue and                 Dizziness/Lightheadedness.  Sonographer:    Samule Ohm RDCS Referring Phys: [id number] [facility] IMPRESSIONS  1. Left ventricular ejection fraction, by estimation, is 60 to 65%. The left ventricle has normal function. The left ventricle has no regional wall motion abnormalities. Left ventricular diastolic parameters were normal. The average left ventricular global longitudinal strain is -20.8 %. The global longitudinal strain is normal.  2. Right ventricular systolic function is normal. The right ventricular size is normal.  3. The mitral valve is normal in structure. Trivial mitral valve regurgitation. No evidence of mitral stenosis.  4. The aortic valve is tricuspid. Aortic valve regurgitation is not visualized. No aortic stenosis is present.  5. The inferior vena cava is normal in size with greater than 50% respiratory variability, suggesting right atrial pressure of 3 mmHg. Comparison(s): No prior Echocardiogram. FINDINGS  Left Ventricle: Left ventricular ejection fraction, by estimation, is 60 to 65%. The left ventricle has normal function. The left ventricle has no regional wall motion abnormalities. The average left ventricular global longitudinal strain is -20.8 %. The global longitudinal strain is normal. The left ventricular internal cavity size was normal in size. There is no left ventricular hypertrophy. Left ventricular diastolic parameters were normal. Right Ventricle: The right ventricular size is normal. Right ventricular systolic function is normal. Left Atrium: Left atrial size was normal in size. Right Atrium: Right atrial size was normal in size. Pericardium: There is no evidence of pericardial effusion. Mitral Valve: The mitral valve is normal in structure. Trivial mitral valve regurgitation. No evidence of mitral valve stenosis. Tricuspid Valve: The tricuspid valve is normal in structure. Tricuspid valve regurgitation is trivial. No  evidence of tricuspid stenosis. Aortic Valve: The aortic valve is tricuspid. Aortic valve regurgitation is not visualized. No aortic stenosis is present. Pulmonic Valve: The pulmonic valve was normal in structure. Pulmonic valve regurgitation is not visualized. No evidence of pulmonic stenosis. Aorta: The aortic root is normal in size and structure. Venous: The inferior vena cava is normal in size with greater than 50% respiratory variability, suggesting right atrial pressure of 3 mmHg. IAS/Shunts: No atrial level shunt detected by color flow Doppler.  LEFT VENTRICLE PLAX 2D LVIDd:         4.30 cm   Diastology LVIDs:         3.00 cm   LV e' medial:    11.00 cm/s LV PW:         0.90 cm   LV E/e' medial:  10.5 LV IVS:        0.80 cm   LV e' lateral:   12.70 cm/s LVOT diam:     1.80 cm   LV E/e' lateral: 9.1 LV SV:         64 LV SV Index:   33        2D Longitudinal Strain LVOT Area:     2.54 cm  2D Strain GLS Avg:     -20.8 %  RIGHT VENTRICLE             IVC RV S prime:     12.60 cm/s  IVC diam: 1.40 cm TAPSE (M-mode): 2.2 cm LEFT ATRIUM             Index        RIGHT ATRIUM           Index LA diam:  3.10 cm 1.57 cm/m   RA Pressure: 3.00 mmHg LA Vol (A2C):   32.5 ml 16.49 ml/m  RA Area:     11.40 cm LA Vol (A4C):   39.3 ml 19.94 ml/m  RA Volume:   26.00 ml  13.19 ml/m LA Biplane Vol: 36.3 ml 18.41 ml/m  AORTIC VALVE LVOT Vmax:   121.00 cm/s LVOT Vmean:  76.600 cm/s LVOT VTI:    0.252 m  AORTA Ao Root diam: 3.00 cm Ao Asc diam:  2.50 cm MITRAL VALVE                TRICUSPID VALVE MV Area (PHT): 3.39 cm     Estimated RAP:  3.00 mmHg MV Decel Time: 224 msec MV E velocity: 115.00 cm/s  SHUNTS MV A velocity: 100.00 cm/s  Systemic VTI:  0.25 m MV E/A ratio:  1.15         Systemic Diam: 1.80 cm Olga Millers MD Electronically signed by Olga Millers MD Signature Date/Time: 07/18/2023/3:31:24 PM    Final      Assessment & Plan:   No problem-specific Assessment & Plan notes found for this  encounter.     Glenford Bayley, NP 08/16/2023

## 2023-08-18 NOTE — Assessment & Plan Note (Signed)
-   Hx moderate OSA, AHI 25.8/hour. She is moderately compliant with CPAP, had a couple breaks in use due to vacation and dental work. Received new nasal mask. No issues with mask fit or pressure settings. She reports benefit in fatigue and headaches since wearing PAP more consistently and taking a daily multivitamin. Current pressure 10cm h20; Residual AHI 3.0/hour. No changes recommended.  Vies patient continue to wear nightly 4 to 6 hours or longer.  FU in 1 year or sooner if needed.

## 2023-09-19 ENCOUNTER — Other Ambulatory Visit (HOSPITAL_COMMUNITY): Payer: Self-pay

## 2023-09-23 ENCOUNTER — Other Ambulatory Visit (HOSPITAL_COMMUNITY): Payer: Self-pay

## 2023-09-23 MED ORDER — WEGOVY 0.25 MG/0.5ML ~~LOC~~ SOAJ
0.2500 mg | SUBCUTANEOUS | 0 refills | Status: DC
  Filled 2023-09-23: qty 2, 28d supply, fill #0

## 2023-09-24 ENCOUNTER — Other Ambulatory Visit (HOSPITAL_COMMUNITY): Payer: Self-pay

## 2023-10-20 ENCOUNTER — Other Ambulatory Visit (HOSPITAL_COMMUNITY): Payer: Self-pay

## 2023-10-21 ENCOUNTER — Other Ambulatory Visit (HOSPITAL_COMMUNITY): Payer: Self-pay

## 2023-10-21 MED ORDER — SEMAGLUTIDE-WEIGHT MANAGEMENT 0.25 MG/0.5ML ~~LOC~~ SOAJ
0.2500 mg | SUBCUTANEOUS | 0 refills | Status: DC
  Filled 2023-10-21: qty 2, 28d supply, fill #0

## 2023-10-23 ENCOUNTER — Other Ambulatory Visit (HOSPITAL_COMMUNITY): Payer: Self-pay

## 2023-10-24 ENCOUNTER — Ambulatory Visit: Admitting: Dietician

## 2023-10-25 ENCOUNTER — Other Ambulatory Visit: Payer: Self-pay

## 2023-10-25 ENCOUNTER — Other Ambulatory Visit (HOSPITAL_COMMUNITY): Payer: Self-pay

## 2023-10-25 MED ORDER — WEGOVY 0.5 MG/0.5ML ~~LOC~~ SOAJ
0.5000 mg | SUBCUTANEOUS | 0 refills | Status: DC
  Filled 2023-10-25 (×3): qty 2, 28d supply, fill #0

## 2023-10-28 ENCOUNTER — Other Ambulatory Visit (HOSPITAL_COMMUNITY): Payer: Self-pay

## 2023-10-31 ENCOUNTER — Other Ambulatory Visit (HOSPITAL_COMMUNITY): Payer: Self-pay

## 2023-11-18 ENCOUNTER — Other Ambulatory Visit (HOSPITAL_COMMUNITY): Payer: Self-pay

## 2023-11-18 MED ORDER — WEGOVY 1 MG/0.5ML ~~LOC~~ SOAJ
1.0000 mg | SUBCUTANEOUS | 2 refills | Status: DC
  Filled 2023-11-18: qty 2, 28d supply, fill #0

## 2023-11-23 ENCOUNTER — Other Ambulatory Visit (HOSPITAL_COMMUNITY): Payer: Self-pay

## 2023-12-16 ENCOUNTER — Other Ambulatory Visit (HOSPITAL_COMMUNITY): Payer: Self-pay

## 2023-12-16 MED ORDER — WEGOVY 1.7 MG/0.75ML ~~LOC~~ SOAJ
1.7000 mg | SUBCUTANEOUS | 0 refills | Status: DC
  Filled 2023-12-16: qty 3, 28d supply, fill #0

## 2023-12-20 ENCOUNTER — Other Ambulatory Visit (HOSPITAL_COMMUNITY): Payer: Self-pay

## 2024-01-20 ENCOUNTER — Other Ambulatory Visit (HOSPITAL_COMMUNITY): Payer: Self-pay

## 2024-01-20 MED ORDER — WEGOVY 2.4 MG/0.75ML ~~LOC~~ SOAJ
2.4000 mg | SUBCUTANEOUS | 2 refills | Status: DC
  Filled 2024-01-20: qty 3, 28d supply, fill #0

## 2024-02-13 ENCOUNTER — Other Ambulatory Visit (HOSPITAL_COMMUNITY): Payer: Self-pay

## 2024-02-13 MED ORDER — WEGOVY 2.4 MG/0.75ML ~~LOC~~ SOAJ
2.4000 mg | SUBCUTANEOUS | 0 refills | Status: DC
  Filled 2024-02-26: qty 3, 28d supply, fill #0
  Filled 2024-03-20: qty 3, 28d supply, fill #1

## 2024-02-26 ENCOUNTER — Other Ambulatory Visit (HOSPITAL_COMMUNITY): Payer: Self-pay

## 2024-04-20 ENCOUNTER — Other Ambulatory Visit (HOSPITAL_COMMUNITY): Payer: Self-pay

## 2024-04-20 MED ORDER — SEMAGLUTIDE-WEIGHT MANAGEMENT 2.4 MG/0.75ML ~~LOC~~ SOAJ
2.4000 mg | SUBCUTANEOUS | 0 refills | Status: DC
  Filled 2024-04-20: qty 3, 28d supply, fill #0
  Filled 2024-05-25: qty 3, 28d supply, fill #1
  Filled 2024-06-17: qty 3, 28d supply, fill #2

## 2024-05-25 ENCOUNTER — Other Ambulatory Visit (HOSPITAL_COMMUNITY): Payer: Self-pay

## 2024-06-29 ENCOUNTER — Emergency Department (EMERGENCY_DEPARTMENT_HOSPITAL)

## 2024-06-29 ENCOUNTER — Emergency Department (HOSPITAL_BASED_OUTPATIENT_CLINIC_OR_DEPARTMENT_OTHER)
Admission: EM | Admit: 2024-06-29 | Discharge: 2024-06-29 | Disposition: A | Discharge status: Home / Self Care | Attending: Emergency Medicine | Admitting: Emergency Medicine

## 2024-06-29 ENCOUNTER — Encounter (HOSPITAL_BASED_OUTPATIENT_CLINIC_OR_DEPARTMENT_OTHER): Payer: Self-pay

## 2024-06-29 ENCOUNTER — Other Ambulatory Visit: Payer: Self-pay

## 2024-06-29 DIAGNOSIS — R109 Unspecified abdominal pain: Secondary | ICD-10-CM | POA: Diagnosis not present

## 2024-06-29 DIAGNOSIS — R1032 Left lower quadrant pain: Secondary | ICD-10-CM

## 2024-06-29 DIAGNOSIS — R102 Pelvic and perineal pain: Secondary | ICD-10-CM | POA: Insufficient documentation

## 2024-06-29 DIAGNOSIS — R110 Nausea: Secondary | ICD-10-CM | POA: Diagnosis not present

## 2024-06-29 DIAGNOSIS — R7989 Other specified abnormal findings of blood chemistry: Secondary | ICD-10-CM | POA: Diagnosis not present

## 2024-06-29 DIAGNOSIS — Z79899 Other long term (current) drug therapy: Secondary | ICD-10-CM | POA: Insufficient documentation

## 2024-06-29 DIAGNOSIS — E039 Hypothyroidism, unspecified: Secondary | ICD-10-CM | POA: Diagnosis not present

## 2024-06-29 LAB — CBC
HCT: 42.4 % (ref 36.0–46.0)
Hemoglobin: 14.5 g/dL (ref 12.0–15.0)
MCH: 27.5 pg (ref 26.0–34.0)
MCHC: 34.2 g/dL (ref 30.0–36.0)
MCV: 80.5 fL (ref 80.0–100.0)
Platelets: 272 K/uL (ref 150–400)
RBC: 5.27 MIL/uL — ABNORMAL HIGH (ref 3.87–5.11)
RDW: 12.7 % (ref 11.5–15.5)
WBC: 8.3 K/uL (ref 4.0–10.5)
nRBC: 0 % (ref 0.0–0.2)

## 2024-06-29 LAB — URINALYSIS, ROUTINE W REFLEX MICROSCOPIC
Bacteria, UA: NONE SEEN
Bilirubin Urine: NEGATIVE
Glucose, UA: NEGATIVE mg/dL
Hgb urine dipstick: NEGATIVE
Ketones, ur: NEGATIVE mg/dL
Leukocytes,Ua: NEGATIVE
Nitrite: NEGATIVE
Protein, ur: NEGATIVE mg/dL
Specific Gravity, Urine: 1.024 (ref 1.005–1.030)
pH: 6 (ref 5.0–8.0)

## 2024-06-29 LAB — COMPREHENSIVE METABOLIC PANEL WITH GFR
ALT: 21 U/L (ref 0–44)
AST: 21 U/L (ref 15–41)
Albumin: 4.2 g/dL (ref 3.5–5.0)
Alkaline Phosphatase: 111 U/L (ref 38–126)
Anion gap: 13 (ref 5–15)
BUN: 15 mg/dL (ref 6–20)
CO2: 22 mmol/L (ref 22–32)
Calcium: 9.3 mg/dL (ref 8.9–10.3)
Chloride: 102 mmol/L (ref 98–111)
Creatinine, Ser: 1.12 mg/dL — ABNORMAL HIGH (ref 0.44–1.00)
GFR, Estimated: 58 mL/min — ABNORMAL LOW (ref >60)
Glucose, Bld: 94 mg/dL (ref 70–99)
Potassium: 4.3 mmol/L (ref 3.5–5.1)
Sodium: 136 mmol/L (ref 135–145)
Total Bilirubin: 0.4 mg/dL (ref 0.0–1.2)
Total Protein: 6.9 g/dL (ref 6.5–8.1)

## 2024-06-29 LAB — LIPASE, BLOOD: Lipase: 69 U/L — ABNORMAL HIGH (ref 11–51)

## 2024-06-29 MED ORDER — ONDANSETRON 4 MG PO TBDP: 4.0000 mg | ORAL_TABLET | Freq: Three times a day (TID) | ORAL | 0 refills | Status: AC | PRN

## 2024-06-29 MED ORDER — CELECOXIB 200 MG PO CAPS: 200.0000 mg | ORAL_CAPSULE | Freq: Two times a day (BID) | ORAL | 0 refills | Status: AC | PRN

## 2024-06-29 MED ORDER — IOHEXOL 300 MG/ML  SOLN
100.0000 mL | Freq: Once | INTRAMUSCULAR | Status: AC | PRN
  Administered 2024-06-29 (×2): 85 mL via INTRAVENOUS

## 2024-06-29 MED ORDER — KETOROLAC TROMETHAMINE 15 MG/ML IJ SOLN
15.0000 mg | Freq: Once | INTRAMUSCULAR | Status: DC
  Filled 2024-06-29: qty 1

## 2024-06-29 NOTE — ED Triage Notes (Signed)
 Pelvic and abd pain. Currently being treated for a parasitic infection. Denies dysuria or hematuria. Lower back pain. N/V/D.

## 2024-06-29 NOTE — Discharge Instructions (Addendum)
 As discussed, your workup today was reassuring.  CT imaging showed nothing acutely abnormal inside your abdomen or pelvis as cause of your symptoms.  No obvious hernia, diverticulitis or other intra-abdominal/intrapelvic abnormality.  Your labs are also reassuring as well.  Will send you home with prescription strength anti-inflammatory to use as needed.  Recommend follow-up with your primary care for reassessment.  Please not hesitate to return to the emergency department if the worrisome signs and symptoms discussed become apparent.

## 2024-06-29 NOTE — ED Notes (Signed)
 Pt alert and oriented X 4 at the time of discharge. RR even and unlabored. No acute distress noted. Pt verbalized understanding of discharge instructions as discussed. Pt ambulatory to lobby at time of discharge.

## 2024-06-29 NOTE — ED Provider Notes (Signed)
 [facility] EMERGENCY DEPARTMENT AT [facility] Provider Note   CSN: [id number] Arrival date & time: 06/29/24  1010     Patient presents with: Emesis and Pelvic Pain   Dawn Delacruz is a 53 y.o. female.    Emesis Pelvic Pain   53 year old female presents emergency department with complaints of lower Delacruz/pelvic pain.  States the pain began last night when she was laying watching TV.  Reports persistence of pain this morning prompting visit to the ED.  Reports nausea with no emesis.  Denies any fevers, chills, urinary symptoms, vaginal symptoms, change in bowel habits.  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  States that she is been on this for a month and does not think it is related.  Denies any history of similar symptoms.  Denies any palpable mass/bulge in the area where she is having discomfort.  Past medical history significant for hypothyroidism, hypersomnolence, allergic rhinitis, OSA  Prior to Admission medications   Medication Sig Start Date End Date Taking? Authorizing Provider  escitalopram (LEXAPRO) 10 MG tablet Take 10 mg by mouth daily. 08/19/19  Yes [provider]  levothyroxine (SYNTHROID) 100 MCG tablet Take 100 mcg by mouth daily. 11/12/19  Yes [provider]  loratadine  (CLARITIN ) 10 MG tablet Take 1 tablet (10 mg total) by mouth daily. 02/13/23  Yes Hope Almarie ORN, NP  Semaglutide -Weight Management (WEGOVY ) 2.4 MG/0.75ML SOAJ Inject 2.4 mg into the skin once a week. 04/20/24  Yes   sulfamethoxazole-trimethoprim (BACTRIM DS) 800-160 MG tablet Take 1 tablet by mouth 2 (two) times daily.   Yes [provider]  valACYclovir (VALTREX) 1000 MG tablet  03/27/21  Yes [provider]  montelukast (SINGULAIR) 10 MG tablet Take 10 mg by mouth daily. 01/17/22   [provider]    Allergies: Doxycycline and Progesterone    Review of Systems   Gastrointestinal:  Positive for vomiting.  Genitourinary:  Positive for pelvic pain.  All other systems reviewed and are negative.   Updated Vital Signs BP 106/70 (BP Location: Left Arm)   Pulse 87   Temp 98.6 F (37 C) (Oral)   Resp 16   Ht 5' 5 (1.651 m)   Wt 71.2 kg   SpO2 100%   BMI 26.13 kg/m   Physical Exam Vitals and nursing note reviewed.  Constitutional:      General: She is not in acute distress.    Appearance: She is well-developed.  HENT:     Head: Normocephalic and atraumatic.  Eyes:     Conjunctiva/sclera: Conjunctivae normal.  Cardiovascular:     Rate and Rhythm: Normal rate and regular rhythm.     Heart sounds: No murmur heard. Pulmonary:     Effort: Pulmonary effort is normal. No respiratory distress.     Breath sounds: Normal breath sounds.  Delacruz:     Palpations: Abdomen is soft.     Tenderness: There is Delacruz tenderness in the left lower quadrant.  Musculoskeletal:        General: No swelling.     Cervical back: Neck supple.  Skin:    General: Skin is warm and dry.     Capillary Refill: Capillary refill takes less than 2 seconds.  Neurological:     Mental Status: She is alert.  Psychiatric:        Mood and Affect: Mood normal.     (all labs ordered are listed, but only  abnormal results are displayed) Labs Reviewed  LIPASE, BLOOD - Abnormal; Notable for the following components:      Result Value   Lipase 69 (*)    All other components within normal limits  COMPREHENSIVE METABOLIC PANEL WITH GFR - Abnormal; Notable for the following components:   Creatinine, Ser 1.12 (*)    GFR, Estimated 58 (*)    All other components within normal limits  CBC - Abnormal; Notable for the following components:   RBC 5.27 (*)    All other components within normal limits  URINALYSIS, ROUTINE W REFLEX MICROSCOPIC    EKG: None  Radiology: No results found.   Procedures   Medications Ordered in the ED  iohexol  (OMNIPAQUE ) 300 MG/ML  solution 100 mL (has no administration in time range)                                    Medical Decision Making Amount and/or Complexity of Data Reviewed Labs: ordered. Radiology: ordered.  Risk Prescription drug management.   This patient presents to the ED for concern of Delacruz pain, this involves an extensive number of treatment options, and is a complaint that carries with it a high risk of complications and morbidity.  The differential diagnosis includes gastritis, PUD, cholecystitis, CBD pathology, diverticulitis, appendicitis, SBO/LBO, volvulus, hernia, other   Co morbidities that complicate the patient evaluation  See HPI   Additional history obtained:  Additional history obtained from EMR External records from outside source obtained and reviewed including hospital records   Lab Tests:  I Ordered, and personally interpreted labs.  The pertinent results include: No leukocytosis.  No evidence of anemia.  Platelets within range.  Lipase of 69.  No electrolyte abnormalities.  Slight creatinine elevation of 1.12 from around 0.96 baseline.  No transaminitis.  UA without abnormality.   Imaging Studies ordered:  I ordered imaging studies including CT abdomen pelvis I independently visualized and interpreted imaging which showed no acute abnormality I agree with the radiologist interpretation   Cardiac Monitoring: / EKG:  The patient was maintained on a cardiac monitor.  I personally viewed and interpreted the cardiac monitored which showed an underlying rhythm of: Sinus rhythm   Consultations Obtained:  N/a   Problem List / ED Course / Critical interventions / Medication management  Left lower Delacruz pain I ordered medication including morphine, Zofran    Reevaluation of the patient after these medicines showed that the patient improved I have reviewed the patients home medicines and have made adjustments as needed   Social Determinants of  Health:  Denies tobacco, illicit drug use.   Test / Admission - Considered:  Left lower Delacruz pain Vitals signs within normal range and stable throughout visit. Laboratory/imaging studies significant for: See above 53 year old female presents emergency department with complaints of lower Delacruz/pelvic pain.  States the pain began last night when she was laying watching TV.  Reports persistence of pain this morning prompting visit to the ED.  Reports nausea with no emesis.  Denies any fevers, chills, urinary symptoms, vaginal symptoms, change in bowel habits.  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  States that she is been on this for a month and does not think it is related.  Denies any history of similar symptoms.  Denies any palpable mass/bulge in the area where she is having discomfort.  On exam, reproducible tenderness left lower quadrant of abdomen without palpable mass/deformity.  Labs unremarkable for any acute emergent process.  CT imaging obtained did not show obvious acute intra-Delacruz/pelvic abnormality as cause of symptoms.  Unsure of exact etiology but could be MSK related.  Will trial NSAIDs to use as needed for Delacruz discomfort.  Recommend follow-up with PCP in the outpatient setting for reassessment.  Treatment plan discussed with patient and she acknowledged understanding was agreeable to said plan.  Patient well-appearing, afebrile in no acute distress upon discharge. Worrisome signs and symptoms were discussed with the patient, and the patient acknowledged understanding to return to the ED if noticed. Patient was stable upon discharge.       Final diagnoses:  None    ED Discharge Orders     None          Silver Wonda LABOR, [place] 06/29/24 1300    Dasie Faden, MD 06/30/24 [phone]

## 2024-08-03 ENCOUNTER — Other Ambulatory Visit (HOSPITAL_COMMUNITY): Payer: Self-pay

## 2024-08-04 ENCOUNTER — Other Ambulatory Visit (HOSPITAL_COMMUNITY): Payer: Self-pay

## 2024-08-04 MED ORDER — SEMAGLUTIDE-WEIGHT MANAGEMENT 2.4 MG/0.75ML ~~LOC~~ SOAJ
2.4000 mg | SUBCUTANEOUS | 0 refills | Status: DC
  Filled 2024-08-04: qty 3, 28d supply, fill #0
  Filled 2024-09-15 (×3): qty 3, 28d supply, fill #1
  Filled 2024-10-31: qty 3, 28d supply, fill #2

## 2024-08-14 ENCOUNTER — Ambulatory Visit: Admitting: Primary Care

## 2024-09-15 ENCOUNTER — Other Ambulatory Visit (HOSPITAL_COMMUNITY): Payer: Self-pay

## 2024-09-15 ENCOUNTER — Other Ambulatory Visit: Payer: Self-pay

## 2024-09-15 MED ORDER — FLUZONE 0.5 ML IM SUSY
PREFILLED_SYRINGE | INTRAMUSCULAR | 0 refills | Status: AC
  Filled 2024-09-15: qty 0.5, 1d supply, fill #0

## 2024-10-08 ENCOUNTER — Other Ambulatory Visit (HOSPITAL_COMMUNITY): Payer: Self-pay

## 2024-12-03 ENCOUNTER — Other Ambulatory Visit (HOSPITAL_COMMUNITY): Payer: Self-pay

## 2024-12-04 ENCOUNTER — Other Ambulatory Visit: Payer: Self-pay

## 2024-12-04 MED ORDER — SEMAGLUTIDE-WEIGHT MANAGEMENT 2.4 MG/0.75ML ~~LOC~~ SOAJ
2.4000 mg | SUBCUTANEOUS | 0 refills | Status: AC
  Filled 2024-12-04: qty 3, 28d supply, fill #0
